# Patient Record
Sex: Male | Born: 1945 | Race: White | Hispanic: No | Smoking: Former smoker
Health system: Southern US, Community
[De-identification: ages and names within clinical notes are randomized; demographics above are authoritative.]

## PROBLEM LIST (undated history)

## (undated) DIAGNOSIS — N411 Chronic prostatitis: Secondary | ICD-10-CM

## (undated) DIAGNOSIS — L821 Other seborrheic keratosis: Secondary | ICD-10-CM

## (undated) DIAGNOSIS — K5790 Diverticulosis of intestine, part unspecified, without perforation or abscess without bleeding: Secondary | ICD-10-CM

## (undated) DIAGNOSIS — N400 Enlarged prostate without lower urinary tract symptoms: Secondary | ICD-10-CM

## (undated) DIAGNOSIS — R972 Elevated prostate specific antigen [PSA]: Secondary | ICD-10-CM

## (undated) DIAGNOSIS — E785 Hyperlipidemia, unspecified: Secondary | ICD-10-CM

## (undated) DIAGNOSIS — Z87891 Personal history of nicotine dependence: Secondary | ICD-10-CM

## (undated) DIAGNOSIS — F17200 Nicotine dependence, unspecified, uncomplicated: Secondary | ICD-10-CM

## (undated) DIAGNOSIS — K219 Gastro-esophageal reflux disease without esophagitis: Secondary | ICD-10-CM

## (undated) DIAGNOSIS — M4300 Spondylolysis, site unspecified: Secondary | ICD-10-CM

## (undated) DIAGNOSIS — I10 Essential (primary) hypertension: Secondary | ICD-10-CM

## (undated) DIAGNOSIS — I2510 Atherosclerotic heart disease of native coronary artery without angina pectoris: Secondary | ICD-10-CM

## (undated) DIAGNOSIS — F419 Anxiety disorder, unspecified: Secondary | ICD-10-CM

## (undated) DIAGNOSIS — Z9861 Coronary angioplasty status: Secondary | ICD-10-CM

## (undated) HISTORY — DX: Benign prostatic hyperplasia without lower urinary tract symptoms: N40.0

## (undated) HISTORY — DX: Diverticulosis of intestine, part unspecified, without perforation or abscess without bleeding: K57.90

## (undated) HISTORY — DX: Elevated prostate specific antigen (PSA): R97.20

## (undated) HISTORY — DX: Nicotine dependence, unspecified, uncomplicated: F17.200

## (undated) HISTORY — DX: Anxiety disorder, unspecified: F41.9

## (undated) HISTORY — DX: Chronic prostatitis: N41.1

## (undated) HISTORY — DX: Hyperlipidemia, unspecified: E78.5

## (undated) HISTORY — DX: Essential (primary) hypertension: I10

## (undated) HISTORY — DX: Coronary angioplasty status: Z98.61

## (undated) HISTORY — DX: Other seborrheic keratosis: L82.1

## (undated) HISTORY — DX: Gastro-esophageal reflux disease without esophagitis: K21.9

## (undated) HISTORY — PX: COLONOSCOPY: SHX174

## (undated) HISTORY — DX: Personal history of nicotine dependence: Z87.891

## (undated) HISTORY — DX: Spondylolysis, site unspecified: M43.00

## (undated) HISTORY — DX: Atherosclerotic heart disease of native coronary artery without angina pectoris: I25.10

## (undated) HISTORY — PX: CORONARY STENT PLACEMENT: SHX1402

---

## 1995-03-27 HISTORY — PX: POLYPECTOMY: SHX149

## 1998-03-28 ENCOUNTER — Inpatient Hospital Stay (HOSPITAL_COMMUNITY): Admission: EM | Admit: 1998-03-28 | Discharge: 1998-03-31 | Payer: Self-pay | Admitting: Cardiovascular Disease

## 1998-04-05 ENCOUNTER — Observation Stay (HOSPITAL_COMMUNITY): Admission: AD | Admit: 1998-04-05 | Discharge: 1998-04-06 | Payer: Self-pay | Admitting: Cardiovascular Disease

## 1998-05-16 ENCOUNTER — Inpatient Hospital Stay (HOSPITAL_COMMUNITY): Admission: EM | Admit: 1998-05-16 | Discharge: 1998-05-19 | Payer: Self-pay | Admitting: Emergency Medicine

## 2000-11-22 ENCOUNTER — Emergency Department (HOSPITAL_COMMUNITY): Admission: EM | Admit: 2000-11-22 | Discharge: 2000-11-22 | Payer: Self-pay | Admitting: *Deleted

## 2002-01-17 ENCOUNTER — Emergency Department (HOSPITAL_COMMUNITY): Admission: EM | Admit: 2002-01-17 | Discharge: 2002-01-17 | Payer: Self-pay | Admitting: Internal Medicine

## 2003-05-07 ENCOUNTER — Ambulatory Visit (HOSPITAL_COMMUNITY): Admission: RE | Admit: 2003-05-07 | Discharge: 2003-05-07 | Payer: Self-pay | Admitting: Family Medicine

## 2004-06-08 ENCOUNTER — Ambulatory Visit: Payer: Self-pay | Admitting: Cardiology

## 2005-04-24 ENCOUNTER — Ambulatory Visit (HOSPITAL_COMMUNITY): Admission: RE | Admit: 2005-04-24 | Discharge: 2005-04-24 | Payer: Self-pay | Admitting: Family Medicine

## 2005-06-05 ENCOUNTER — Ambulatory Visit: Payer: Self-pay | Admitting: Cardiology

## 2006-04-22 ENCOUNTER — Ambulatory Visit: Payer: Self-pay | Admitting: Cardiology

## 2006-05-22 ENCOUNTER — Encounter (INDEPENDENT_AMBULATORY_CARE_PROVIDER_SITE_OTHER): Payer: Self-pay | Admitting: Specialist

## 2006-05-22 ENCOUNTER — Other Ambulatory Visit: Admission: RE | Admit: 2006-05-22 | Discharge: 2006-05-22 | Payer: Self-pay | Admitting: Family Medicine

## 2007-06-04 ENCOUNTER — Ambulatory Visit: Payer: Self-pay | Admitting: Cardiology

## 2007-06-05 ENCOUNTER — Ambulatory Visit: Payer: Self-pay | Admitting: Cardiology

## 2007-08-27 ENCOUNTER — Ambulatory Visit: Payer: Self-pay | Admitting: Cardiology

## 2007-10-23 ENCOUNTER — Ambulatory Visit (HOSPITAL_COMMUNITY): Admission: RE | Admit: 2007-10-23 | Discharge: 2007-10-23 | Payer: Self-pay | Admitting: Family Medicine

## 2008-03-29 ENCOUNTER — Ambulatory Visit (HOSPITAL_COMMUNITY): Admission: RE | Admit: 2008-03-29 | Discharge: 2008-03-29 | Payer: Self-pay | Admitting: Family Medicine

## 2008-03-30 ENCOUNTER — Ambulatory Visit (HOSPITAL_COMMUNITY): Admission: RE | Admit: 2008-03-30 | Discharge: 2008-03-30 | Payer: Self-pay | Admitting: Family Medicine

## 2008-04-05 ENCOUNTER — Ambulatory Visit (HOSPITAL_COMMUNITY): Admission: RE | Admit: 2008-04-05 | Discharge: 2008-04-05 | Payer: Self-pay | Admitting: Family Medicine

## 2008-04-05 ENCOUNTER — Encounter (INDEPENDENT_AMBULATORY_CARE_PROVIDER_SITE_OTHER): Payer: Self-pay | Admitting: Interventional Radiology

## 2008-06-08 ENCOUNTER — Emergency Department (HOSPITAL_COMMUNITY): Admission: EM | Admit: 2008-06-08 | Discharge: 2008-06-08 | Payer: Self-pay | Admitting: Emergency Medicine

## 2008-07-02 ENCOUNTER — Ambulatory Visit (HOSPITAL_COMMUNITY): Admission: RE | Admit: 2008-07-02 | Discharge: 2008-07-02 | Payer: Self-pay | Admitting: Family Medicine

## 2008-08-20 ENCOUNTER — Encounter (INDEPENDENT_AMBULATORY_CARE_PROVIDER_SITE_OTHER): Payer: Self-pay | Admitting: *Deleted

## 2008-09-08 ENCOUNTER — Ambulatory Visit: Payer: Self-pay | Admitting: Cardiology

## 2008-09-10 ENCOUNTER — Encounter (INDEPENDENT_AMBULATORY_CARE_PROVIDER_SITE_OTHER): Payer: Self-pay | Admitting: *Deleted

## 2008-09-10 LAB — CONVERTED CEMR LAB: Cholesterol: 119 mg/dL

## 2008-10-21 ENCOUNTER — Ambulatory Visit (HOSPITAL_COMMUNITY): Payer: Self-pay | Admitting: Psychology

## 2009-01-06 ENCOUNTER — Telehealth (INDEPENDENT_AMBULATORY_CARE_PROVIDER_SITE_OTHER): Payer: Self-pay | Admitting: *Deleted

## 2009-01-06 ENCOUNTER — Encounter: Payer: Self-pay | Admitting: Cardiology

## 2009-05-11 ENCOUNTER — Encounter: Payer: Self-pay | Admitting: Cardiology

## 2009-05-19 LAB — CONVERTED CEMR LAB
AST: 30 units/L
Blood Glucose, Fasting: 80 mg/dL
CO2: 27 meq/L
Calcium: 9.3 mg/dL
Chloride: 105 meq/L
Cholesterol: 124 mg/dL
HDL: 39 mg/dL
Potassium: 4.8 meq/L
Sodium: 144 meq/L
Triglyceride fasting, serum: 163 mg/dL

## 2009-05-31 ENCOUNTER — Encounter (INDEPENDENT_AMBULATORY_CARE_PROVIDER_SITE_OTHER): Payer: Self-pay | Admitting: *Deleted

## 2009-06-02 ENCOUNTER — Ambulatory Visit: Payer: Self-pay | Admitting: Cardiology

## 2009-06-02 DIAGNOSIS — E785 Hyperlipidemia, unspecified: Secondary | ICD-10-CM

## 2009-06-02 DIAGNOSIS — I10 Essential (primary) hypertension: Secondary | ICD-10-CM | POA: Insufficient documentation

## 2009-06-02 DIAGNOSIS — F419 Anxiety disorder, unspecified: Secondary | ICD-10-CM

## 2009-06-02 DIAGNOSIS — I2510 Atherosclerotic heart disease of native coronary artery without angina pectoris: Secondary | ICD-10-CM | POA: Insufficient documentation

## 2009-06-02 DIAGNOSIS — K219 Gastro-esophageal reflux disease without esophagitis: Secondary | ICD-10-CM

## 2009-06-06 ENCOUNTER — Encounter (INDEPENDENT_AMBULATORY_CARE_PROVIDER_SITE_OTHER): Payer: Self-pay | Admitting: *Deleted

## 2009-06-06 LAB — CONVERTED CEMR LAB: OCCULT 1: NEGATIVE

## 2010-01-23 IMAGING — CT CT CHEST W/ CM
1 series · 15 of 32 positions shown, 19 images · IV contrast (Omnipaque 300)
Comparison: None
Correlation:  Chest radiograph 03/29/2008

CLINICAL DATA: Cough, abnormal chest x-ray with left lung density
elevated ESR 80, WBC 11K

CT CHEST WITH CONTRAST
TECHNIQUE: Multidetector CT imaging of the chest was performed
following the standard protocol during bolus administration of
intravenous contrast.Sagittal and coronal MPR images reconstructed
from axial data set.
Contrast: 80 ml Emnipaque-D00

[Series 2: chestroutine 5.0 b40f · axial · 0.68mm/px · z∈[-306,-66]mm · 15 of 55 slices shown, 19 images]
[im 5/55  mediastinal]
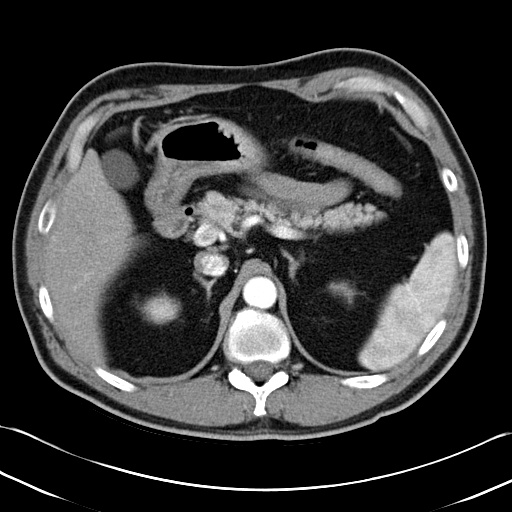
[im 5/55  lung]
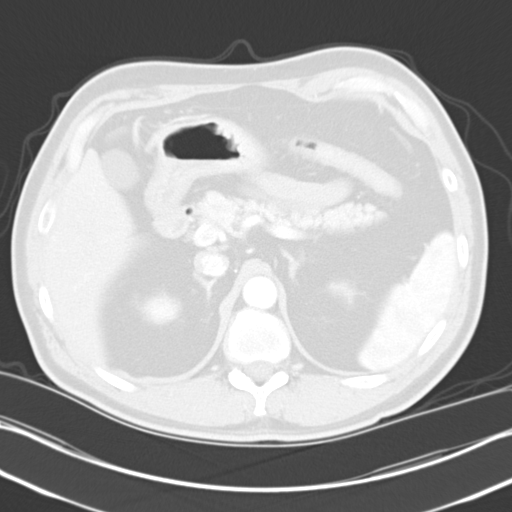
[im 9/55  lung]
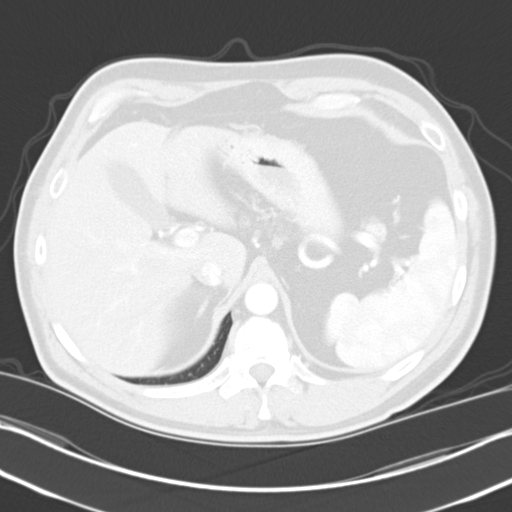
[im 13/55  lung]
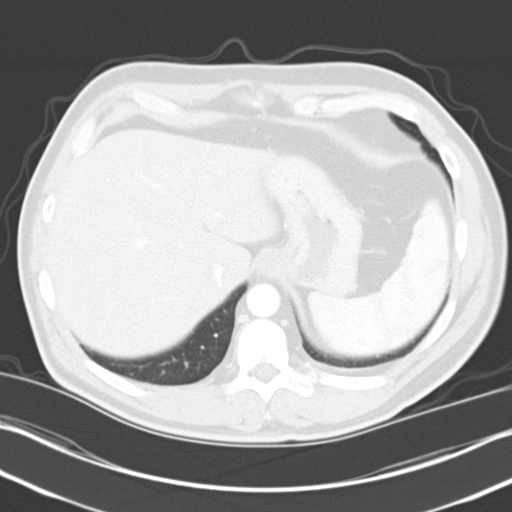
[im 17/55  lung]
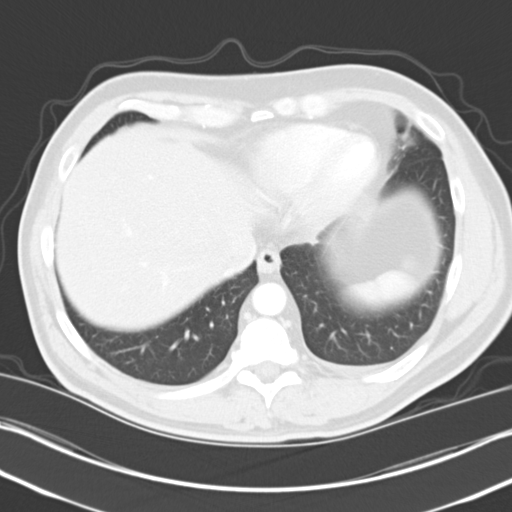
[im 21/55  mediastinal]
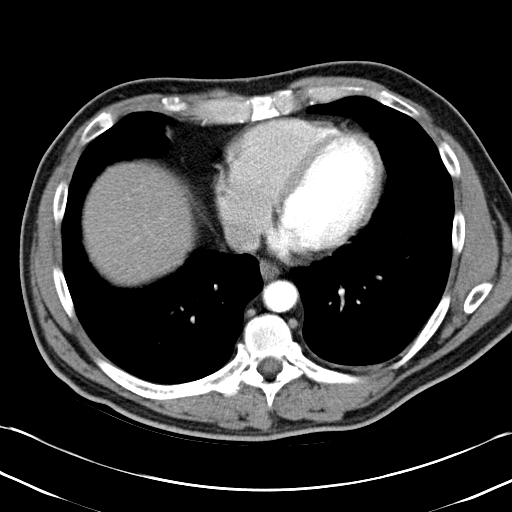
[im 21/55  lung]
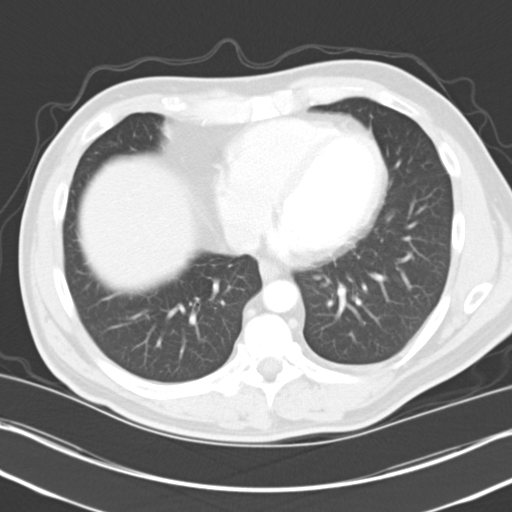
[im 23/55  lung]
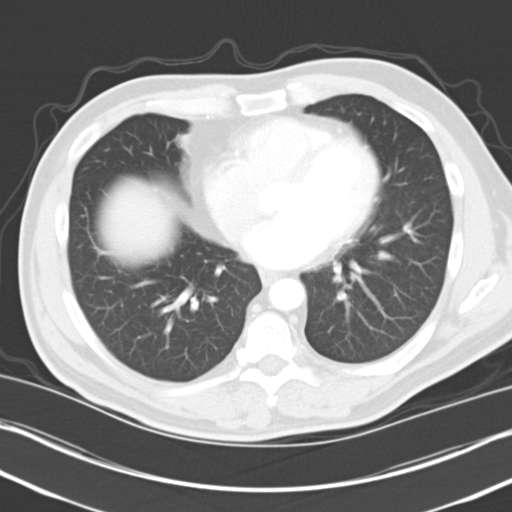
[im 26/55  lung]
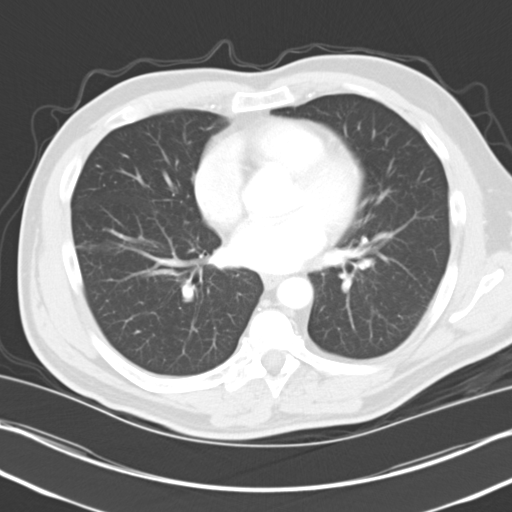
[im 29/55  lung]
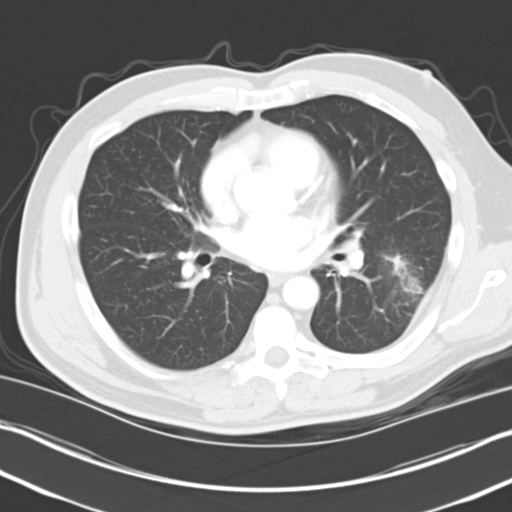
[im 31/55  mediastinal]
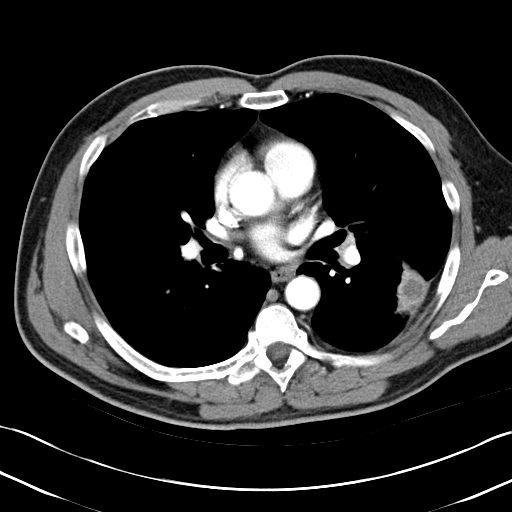
[im 31/55  lung]
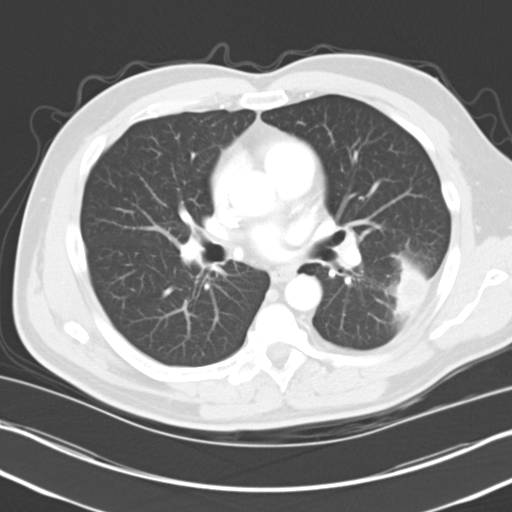
[im 35/55  lung]
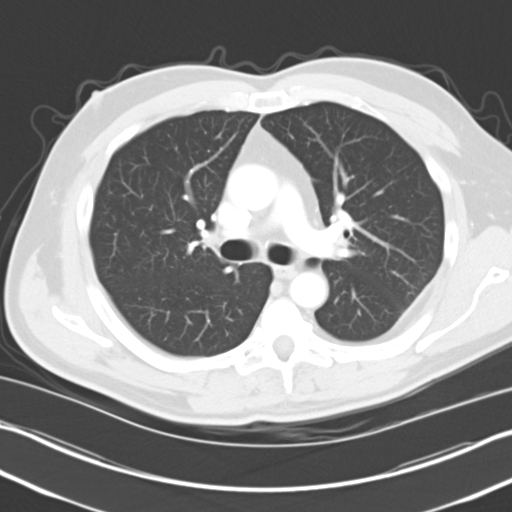
[im 39/55  lung]
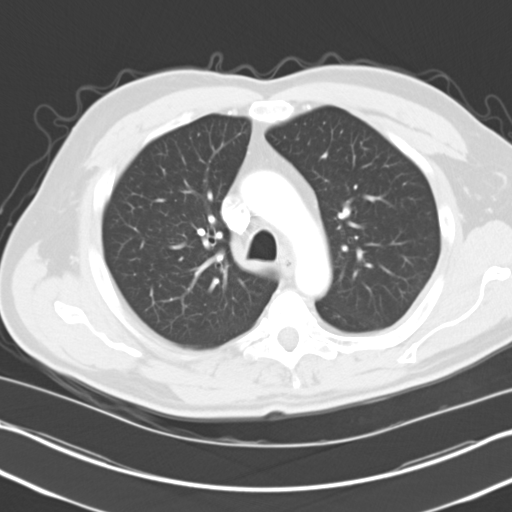
[im 43/55  lung]
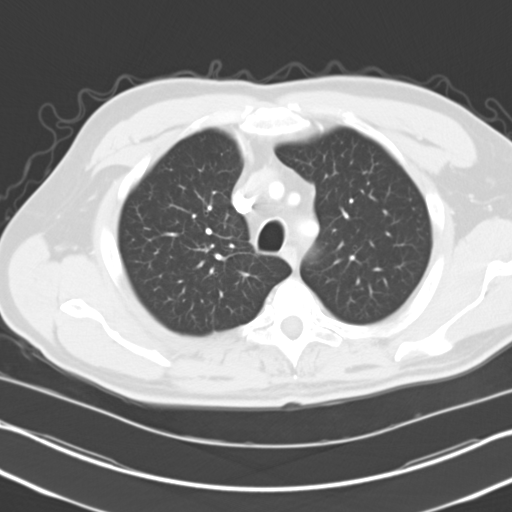
[im 45/55  mediastinal]
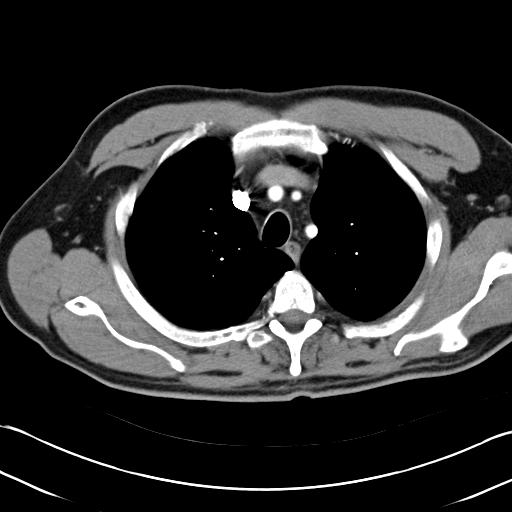
[im 45/55  lung]
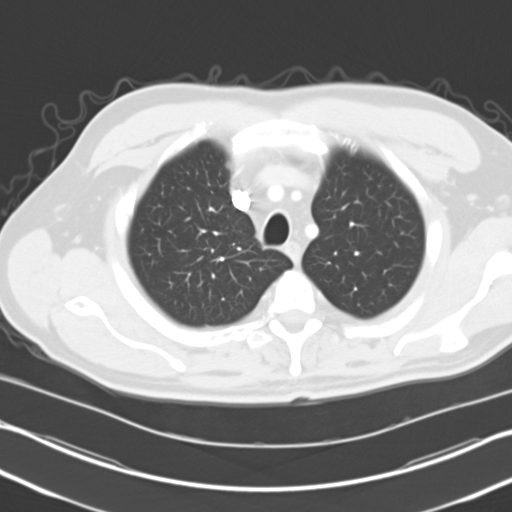
[im 49/55  lung]
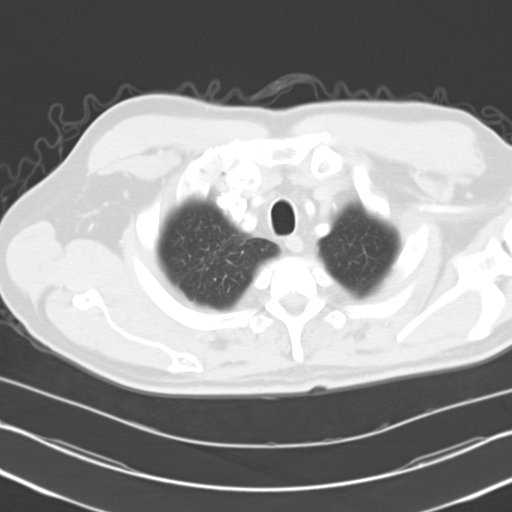
[im 53/55  lung]
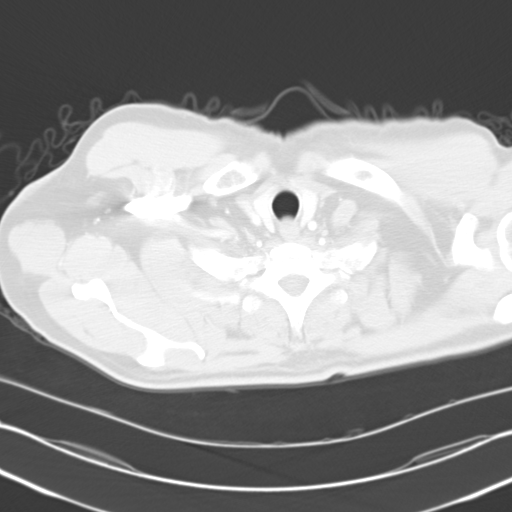

[15 of 32 positions shown; findings below may reference images not displayed]

FINDINGS: Thoracic vascular structures patent on non dedicated exam.
No thoracic adenopathy.
Minimal colonic diverticulosis transverse colon.
Remaining visualized portions of upper abdomen unremarkable.

3.7 x 2.2 x 1.9 cm diameter peripheral mass identified in lateral
aspect, superior segment left lower lobe, abutting the minor
fissure and lateral visceral pleura.
Minimal surrounding poor definition/infiltrate or atelectasis.
Central low attenuation within lesion.
Overlying pleural retraction is identified at posterolateral aspect
of superior segment left lower lobe.
Adjacent subpleural fat deposition in posterolateral left chest,
nonspecific.
Associated pleural thickening.
No focal bony lesions.
IMPRESSION: 3.7 x 2.2 x 1.9 cm diameter peripheral mass in lateral aspect,
superior segment left lower lobe, abutting visceral pleura, with
associated soft central low attenuation (? necrosis), pleural
thickening, and pleural retraction.
Finding is more suspicious for primary lung neoplasm and is felt to
less likely represent potential infection/lung abscess.
No associated adenopathy or metastatic disease identified.

Critical test results telephoned to Dr. Dalvi at the time of
interpretation on 03/30/2008 at 3333 hours.

## 2010-03-22 ENCOUNTER — Ambulatory Visit (HOSPITAL_COMMUNITY): Payer: Self-pay | Admitting: Psychology

## 2010-04-25 NOTE — Miscellaneous (Signed)
Summary: stool cards 06/06/2009  Clinical Lists Changes  Observations: Added new observation of HEMOCCULT 3: neg (06/06/2009 15:43) Added new observation of HEMOCCULT 2: neg (06/06/2009 15:43) Added new observation of HEMOCCULT 1: neg (06/06/2009 15:43)

## 2010-04-25 NOTE — Assessment & Plan Note (Signed)
Summary: ROV      Allergies Added: NKDA  Visit Type:  Follow-up Primary Provider:  Dr. Katharine Look   History of Present Illness: Return visit as scheduled for this very pleasant 65 year old gentleman with coronary artery disease and multiple cardiovascular risk factors.  He continues to be asymptomatic and active.  He exercises at the gym approximately 2 days per week.  Unfortunately, he continues to experience stress related to the multiple medical problems of his wife.  He has refrained from cigarette smoking since his initial symptoms of coronary disease.   Laboratory studies performed in conjunction with his job revealed a normal chemistry profile, normal CBC and an excellent lipid profile.  Colonoscopy was performed to approximately 12 or 13 years ago by Dr. Victorino Dike.  He denies all GI symptoms.  -  Date:  05/19/2009    Cholesterol: 124    LDL: 52    HDL: 39    Triglycerides: 295    FBS 80    Alk Phos: 79    SGPT (ALT): 36    SGOT (AST): 30    Bilirubin-total: 0.5    BUN: 15    Creatinine: 1.04    Sodium: 144    Potassium: 4.8    Chloride: 105    CO2 Total: 27    Calcium: 9.3    Total Protein: 7.1    Albumin: 4.5   Current Medications (verified): 1)  Lipitor 20 Mg Tabs (Atorvastatin Calcium) 2)  Lisinopril-Hydrochlorothiazide 20-12.5 Mg Tabs (Lisinopril-Hydrochlorothiazide) .... Take 1 Tablet By Mouth Once A Day 3)  Metoprolol Succinate 50 Mg Xr24h-Tab (Metoprolol Succinate) .... Take 1 Tablet By Mouth Once A Day 4)  Aspir-Low 81 Mg Tbec (Aspirin)  Allergies (verified): No Known Drug Allergies  Past History:  PMH, FH, and Social History reviewed and updated.  Past Medical History: ASCVD: PTCA of the LAD in 03/1998; recath for a few weeks later showed no restenosis; apical MI in 04/1998       requiring stent to the LAD and HSRA of D1 DYSLIPIDEMIA (ICD-272.4) HYPERTENSION (ICD-401.9) Tobacco abuse-40 pack years discontinued in 03/1998 GERD  (ICD-530.81) ANXIETY (ICD-300.00)  Past Surgical History: Polyp removed vocal cords (1997)  Review of Systems       See history of present illness.  Vital Signs:  Patient profile:   65 year old male Height:      67 inches Weight:      173 pounds BMI:     27.19 Pulse rate:   60 / minute BP sitting:   123 / 80  (right arm)  Vitals Entered By: Dreama Saa, CNA (June 02, 2009 1:25 PM)  Physical Exam  General:    Proportional weight and height; well developed; no acute distress:   Neck-No JVD; no carotid bruits: Lungs-No tachypnea, no rales; no rhonchi; no wheezes: Cardiovascular-normal PMI; normal S1 and S2: Abdomen-BS normal; soft and non-tender without masses or organomegaly:  Musculoskeletal-No deformities, no cyanosis or clubbing: Neurologic-Normal cranial nerves; symmetric strength and tone:  Skin-Warm, no significant lesions: Extremities-Nl distal pulses; no edema:     Impression & Recommendations:  Problem # 1:  ATHEROSCLEROTIC CARDIOVASCULAR DISEASE (ICD-429.2) Patient remains stable with respect to this problem, now 10 years following acute MI and percutaneous intervention for restenosis.  We will continue current approach, optimization of risk factor control.  Problem # 2:  HYPERTENSION (ICD-401.9) Blood pressure control has been excellent with BP systolic typically 110 when measured by the patient.  Current medications will be continued.  Problem # 3:  HYPERLIPIDEMIA (ICD-272.4) Recent lipid profile was excellent with total cholesterol of 124, HDL 39, triglycerides of 163 and LDL of 52.  Current therapy will be continued.  Patient concerned that triglycerides are higher than they have been in the past.  Fish oil, increased exercise and one glass of red wine per night advised.  I will reassess this nice gentleman in one year.  Other Orders: Hemoccult Cards (Take Home) (Hemoccult Cards)  Patient Instructions: 1)  Your physician recommends that you schedule  a follow-up appointment in: 1 YEAR 2)  Your physician has asked that you test your stool for blood. It is necessary to test 3 different stool specimens for accuracy. You will be given 3 hemoccult cards for specimen collection. For each stool specimen, place a small portion of stool sample (from 2 different areas of the stool) into the 2 squares on the card. Close card. Repeat with 2 more stool specimens. Bring the cards back to the office for testing.

## 2010-04-25 NOTE — Miscellaneous (Signed)
Summary: LABS LIPIDS,09/10/2008  Clinical Lists Changes  Observations: Added new observation of LDL: 62 mg/dL (04/54/0981 19:14) Added new observation of HDL: 37 mg/dL (78/29/5621 30:86) Added new observation of TRIGLYC TOT: 98 mg/dL (57/84/6962 95:28) Added new observation of CHOLESTEROL: 119 mg/dL (41/32/4401 02:72)

## 2010-04-25 NOTE — Miscellaneous (Signed)
Summary: lisinopril/hctz refill  Clinical Lists Changes  Medications: Changed medication from LISINOPRIL-HYDROCHLOROTHIAZIDE 20-12.5 MG TABS (LISINOPRIL-HYDROCHLOROTHIAZIDE) to LISINOPRIL-HYDROCHLOROTHIAZIDE 20-12.5 MG TABS (LISINOPRIL-HYDROCHLOROTHIAZIDE) Take 1 tablet by mouth once a day - Signed Rx of LISINOPRIL-HYDROCHLOROTHIAZIDE 20-12.5 MG TABS (LISINOPRIL-HYDROCHLOROTHIAZIDE) Take 1 tablet by mouth once a day;  #30 x 3;  Signed;  Entered by: Teressa Lower RN;  Authorized by: Kathlen Brunswick, MD, [facility];  Method used: Electronically to [facility]*, [address] [place], [place], [place]  [address], Ph: [phone] or [phone], Fax: [phone]    Prescriptions: LISINOPRIL-HYDROCHLOROTHIAZIDE 20-12.5 MG TABS (LISINOPRIL-HYDROCHLOROTHIAZIDE) Take 1 tablet by mouth once a day  #30 x 3   Entered by:   Teressa Lower RN   Authorized by:   Kathlen Brunswick, MD, [facility]   Signed by:   Teressa Lower RN on 05/11/2009   Method used:   Electronically to        [facility]* (retail)       [address]       [place], [place]  [address]       Ph: [phone] or [phone]       Fax: [phone]   RxID:   [id number]

## 2010-06-20 ENCOUNTER — Other Ambulatory Visit: Payer: Self-pay | Admitting: *Deleted

## 2010-06-20 DIAGNOSIS — E785 Hyperlipidemia, unspecified: Secondary | ICD-10-CM

## 2010-06-20 MED ORDER — ATORVASTATIN CALCIUM 20 MG PO TABS: 20.0000 mg | ORAL_TABLET | Freq: Every day | ORAL | Status: DC

## 2010-06-21 ENCOUNTER — Ambulatory Visit (INDEPENDENT_AMBULATORY_CARE_PROVIDER_SITE_OTHER): Payer: BC Managed Care – PPO | Admitting: Cardiology

## 2010-06-21 ENCOUNTER — Encounter: Payer: Self-pay | Admitting: Cardiology

## 2010-06-21 VITALS — BP 93/71 | HR 79 | Ht 67.0 in | Wt 174.0 lb

## 2010-06-21 DIAGNOSIS — I10 Essential (primary) hypertension: Secondary | ICD-10-CM

## 2010-06-21 DIAGNOSIS — F17201 Nicotine dependence, unspecified, in remission: Secondary | ICD-10-CM | POA: Insufficient documentation

## 2010-06-21 DIAGNOSIS — E785 Hyperlipidemia, unspecified: Secondary | ICD-10-CM

## 2010-06-21 DIAGNOSIS — I2510 Atherosclerotic heart disease of native coronary artery without angina pectoris: Secondary | ICD-10-CM

## 2010-06-21 DIAGNOSIS — Z87891 Personal history of nicotine dependence: Secondary | ICD-10-CM

## 2010-06-21 MED ORDER — OMEGA-3 FATTY ACIDS 1000 MG PO CAPS: 1.0000 g | ORAL_CAPSULE | Freq: Two times a day (BID) | ORAL | Status: AC

## 2010-06-21 MED ORDER — ATORVASTATIN CALCIUM 20 MG PO TABS: 20.0000 mg | ORAL_TABLET | Freq: Every day | ORAL | Status: DC

## 2010-06-21 NOTE — Progress Notes (Signed)
HPI : Mr. Brent Brock continues to do well, no 12 years following acute MI requiring urgent intervention.  Risk factor control has been superb.  He has decreased exercise, currently achieving only one session per week.  He has tried to lose weight recently, but has not been particular successful.  He is surprised at how well he has managed the stress associated with his wife's multiple medical problems.  Current Outpatient Prescriptions on File Prior to Visit  Medication Sig Dispense Refill  . aspirin 81 MG tablet Take 81 mg by mouth daily.        Marland Kitchen lisinopril-hydrochlorothiazide (PRINZIDE,ZESTORETIC) 20-12.5 MG per tablet Take 1 tablet by mouth daily.        . metoprolol (TOPROL-XL) 50 MG 24 hr tablet Take 50 mg by mouth daily.        Marland Kitchen DISCONTD: atorvastatin (LIPITOR) 20 MG tablet Take 1 tablet (20 mg total) by mouth at bedtime.  30 tablet  3     No Known Allergies    Past medical history, social history, and family history reviewed and updated.  ROS: General: no anorexia, weight gain or weight loss Cardiac: no chest pain, dyspnea, orthopnea, PND,  or syncope Respiratory: no cough, sputum production or hemoptysis GI: no nausea, abdominal pain, emesis, diarrhea or constipation Integument: no significant lesions Neurologic: No muscle weakness or paralysis; no speech disturbance; no headache  PHYSICAL EXAM: BP 93/71  Pulse 79  Ht 5\' 7"  (1.702 m)  Wt 174 lb (78.926 kg)  BMI 27.25 kg/m2  SpO2 95%  General-Well developed; no acute distress Body habitus-proportionate weight and height Neck-No JVD; no carotid bruits Lungs-clear lung fields; resonant to percussion Cardiovascular-normal PMI; normal S1 and S2 Abdomen-normal bowel sounds; soft and non-tender without masses or organomegaly Musculoskeletal-No deformities, no cyanosis or clubbing Neurologic-Normal cranial nerves; symmetric strength and tone Skin-Warm, no significant lesions Extremities-distal pulses intact; no edema  ASSESSMENT  AND PLAN:

## 2010-06-21 NOTE — Assessment & Plan Note (Addendum)
Functional status remains excellent with no symptoms referable to coronary artery disease.  Patient has done extremely well with optimal control of cardiovascular risk factors and hopefully will continue to do so.  Recent laboratory includes a normal complete metabolic profile, normal CBC and normal TSH level.

## 2010-06-21 NOTE — Assessment & Plan Note (Signed)
Blood pressure remains excellent; current medications will be continued.

## 2010-06-21 NOTE — Patient Instructions (Signed)
Your physician recommends that you schedule a follow-up appointment in:1 YEAR   Your physician has recommended you make the following change in your medication: FISH OIL 100MG  DAILY   INCREASE EXERCISE

## 2010-06-21 NOTE — Assessment & Plan Note (Signed)
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

## 2010-07-10 LAB — CBC
HCT: 39.2 % (ref 39.0–52.0)
Hemoglobin: 13.5 g/dL (ref 13.0–17.0)
MCHC: 34.5 g/dL (ref 30.0–36.0)
MCV: 88.5 fL (ref 78.0–100.0)
RBC: 4.43 MIL/uL (ref 4.22–5.81)

## 2010-07-10 LAB — TISSUE CULTURE

## 2010-07-19 ENCOUNTER — Other Ambulatory Visit: Payer: Self-pay | Admitting: Cardiology

## 2010-08-08 NOTE — Letter (Signed)
June 05, 2007    Brent Brock. Brent Brock, M.D.  [address]  [place], [place] [address]   RE:  Brent Brock, Brent Brock  MRN:  [id number]  /  DOB:  Jan 07, 1946   Dear Brent Brock:   [name] returned to the office today.  He did very well on a stress  test achieving a workload of 12 mets and a heart rate in excess of 160  with no ischemic changes and no symptoms.  We further discussed  psychological issues.  He has been worried about his wife, whose health  has been worse in recent weeks.  He also was dealing with his son-in-  law's possible deployment to one of our war zones and the need for Mr.  Hevia to take care of his three children, all of whom are under the age  of 24.  Fortunately, his son-in-law has permitted to resume his work in  [place] with the [employer].  [name] is able to get to sleep without  difficulty, but wakes up in the middle of the night worrying about one  or more of these issues.  He has multiple additional awakenings during  the night, but seems to get a fairly reasonable amount of sleep.  Nonetheless, he has had more daytime somnolence.  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   I certainly believe that his principle issue is anxiety.  He has some  chronic problems exacerbated by short term stress.  I have given him a  prescription for Sonata to be used for middle of the night awakening and  for Ativan to be used for daytime symptoms.  I emphasize  that he should use these as little as possible and have given him only a  41-month supply.  We talked about some behavioral methods for dealing  with stress and for improving sleep efficiency.  I noted that he could  consider referral to Behavioral Health if he continued to have problems  and should contact you for further follow-up.  I will plan to reassess  this nice gentleman in 2 months.    Sincerely,      Gerrit Friends. Dietrich Pates, MD, [facility]  Electronically Signed    RMR/MedQ  DD: 06/05/2007  DT: 06/06/2007   Job #: [id number]

## 2010-08-08 NOTE — Letter (Signed)
September 08, 2008    [name]. Sudie Bailey, MD  [address] [place], [place] [address]   RE:  Brent Brock, Brent Brock  MRN:  [id number]  /  DOB:  [birth year]   Dear Brent Brock,   Brent Brock returns to the office as scheduled for continued assessment  and treatment of coronary artery disease and cardiovascular risk  factors.  Since restenosis of his angioplasty site in February 2000, he  has done extraordinarily well.  He is active including exercising in the  gym without cardiopulmonary symptoms.  He notes no pedal edema.  Blood  pressure control has been excellent.  He has not had a recent lipid  profile, but that was also excellent when last checked more than a year  ago.  He continues to refrain from cigarette smoking.  He is a model  patient.   Medications are was unchanged from last year including,  1. Atorvastatin 20 mg daily.  2. Lisinopril/hydrochlorothiazide 20/12.5 mg daily.  3. Metoprolol ER 50 mg daily.  4. Aspirin 81 mg daily.  5. He uses Xanax and Sonata p.r.n.   His social situation is stable.  His wife has multiple chronic illnesses  and requires a great deal of care.  He has multiple children and  grandchildren in the area, who enjoys.  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   PHYSICAL EXAMINATION:  GENERAL:  Pleasant proportionate gentleman in no  acute distress.  VITAL SIGNS:  The weight is 170, 4 pounds more than last year.  Blood  pressure 90/75, heart rate 60 and regular, respirations 12 and  unlabored.  NECK:  No jugular venous distention; normal carotid upstrokes without  bruits.  LUNGS:  Clear.  CARDIAC:  Normal first and second heart sounds; modest systolic ejection  murmur.  ABDOMEN:  Soft and nontender; no bruits; no organomegaly.  EXTREMITIES:  Normal distal pulses; no edema.   IMPRESSION:  Brent Brock continues to do very well, now 10 years following  intervention for single-vessel coronary artery disease.  Risk factor  control has been superb.  He  is encouraged to resume fish oil 1 b.i.d.  We discussed the possibility of decreasing metoprolol due to his  relatively low blood pressure.  He has no orthostatic symptoms and no  lightheadedness, whatsoever.  He is very happy with his medical regime.  I have renewed all of his medications and we will plan to see this nice  gentleman again in 1 year.  A chemistry profile and lipid profile are  pending.    Sincerely,      Gerrit Friends. Dietrich Pates, MD, [facility]  Electronically Signed    RMR/MedQ  DD: 09/08/2008  DT: 09/09/2008  Job #: [id number]

## 2010-08-08 NOTE — Letter (Signed)
August 27, 2007    [name]. Sudie Bailey, M.D.  [address] [place], [place] [address]   RE:  Brent Brock, Brent Brock  MRN:  [id number]  /  DOB:  05-21-1945   Dear Brett Canales:   Brent Brock returns to the office following a stress test.  He did  superbly on that study, achieving a workload of 12 METS with no chest  discomfort and a normal electrocardiographic response.  Since his last  visit, he has taken a handful of Xanax and one Sonata.  His anxiety  level is now under control, and he feels quite well.   EXAM:  Pleasant gentleman in no acute distress.  The weight is 166, 5 pounds more than in March.  Blood pressure 100/65,  heart rate 65 and regular, respirations 18.  NECK:  No jugular venous distention.  LUNGS:  Clear.  CARDIAC:  Normal first and second heart sounds.   Brent Brock continues to do well, now more than 9 years following a  multiple interventions for coronary disease.  I will plan to see this  nice gentleman again in 1 year.    Sincerely,      Gerrit Friends. Dietrich Pates, MD, [facility]  Electronically Signed    RMR/MedQ  DD: 08/27/2007  DT: 08/27/2007  Job #: [id number]

## 2010-08-08 NOTE — Letter (Signed)
June 04, 2007    [name]. Sudie Bailey, M.D.  [address]  [place], [place] [address]   RE:  Brent Brock, Brent Brock  MRN:  [id number]  /  DOB:  Sep 13, 1945   Dear Brett Canales:   Mr. Garrels is seen in the office today at his request for episodic chest  and left arm discomfort.  As you know, this nice gentleman has done  superbly since multiple interventions in early 2000 for coronary disease  in the LAD system.  He now presents with fairly vague chest and arm  discomfort in the setting of increased anxiety.  He also describes a  significant sleep disturbance.  His grandchildren have been living with  him during a threatened military deployment of their father.  That  situation has recently resolved.  His wife has been falling and  apparently losing consciousness.  He describes increased anxiety related  to this and related to his chest discomfort which is vague,  unpredictable, and passes spontaneously.  He has had some similar  discomfort in the upper left arm.  There are no associated symptoms.  There is no relationship to exertion, although he has been much less  active in recent weeks.  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   CURRENT MEDICATIONS:  1. Include atorvastatin 20 mg daily.  2. Lisinopril/hydrochlorothiazide 20/12.5 mg daily.  3. Metoprolol ER 50 mg daily.  4. Prevacid 30 mg daily.  5. Aspirin 81 mg daily.   He had a recent lipid profile, chemistry profile, and CBC that are all  excellent.   EXAM:  Pleasant gentleman at an appropriate weight and in no acute  distress.  The weight is 161, 8 pounds less than last year.  Blood pressure 115/80,  heart rate 80 and regular, respirations 18.  NECK:  No jugular venous distention; no carotid bruits.  LUNGS:  Clear.  CARDIAC:  Normal first and second heart sounds; minimal systolic  ejection murmur.  ABDOMEN:  Soft and nontender; no organomegaly.  EXTREMITIES:  No edema; normal distal pulses.   EKG:  Normal sinus  rhythm; within normal limits; no change when compared  to a prior tracing of April 04, 1998.   IMPRESSION:  Mr. Hyser has nonanginal chest discomfort in the setting of  increased anxiety.  I think it most likely that this is somatization,  but certainly there is concern for recurrent symptoms of coronary  disease.  We will proceed with a graded exercise test tomorrow.  If  results are good, I will give him a short term course of benzodiazepines  and reevaluate him in 2 months.    Sincerely,      Gerrit Friends. Dietrich Pates, MD, [facility]  Electronically Signed    RMR/MedQ  DD: 06/04/2007  DT: 06/05/2007  Job #: [id number]

## 2010-08-08 NOTE — Procedures (Signed)
[facility]                              EXERCISE TREADMILL   NAME:Imler, SHADOW SCHEDLER                       MRN:          [id number]  DATE:06/05/2007                            DOB:          02/26/1946    CLINICAL DATA:  65 year old gentleman with prior myocardial infarction  now presents with recurrent chest discomfort.   1. Treadmill exercise performed to a workload of 12 METs and a heart      rate of 176, 110% of the patient's age-predicted maximum.  No chest      discomfort reported. The test was terminated due to generalized      fatigue, leg discomfort, and dyspnea.  2. No arrhythmia is noted.  3. Blood pressure increased from a resting value of 120/80 to 170/70,      a normal response.   Baseline EKG: Normal sinus rhythm; within normal limits.   Stress EKG: No significant change.   IMPRESSION:  Adequate and negative graded exercise test.     Gerrit Friends. Dietrich Pates, MD, [facility]  Electronically Signed    RMR/MedQ  DD: 06/05/2007  DT: 06/05/2007  Job #: [id number]   cc:   [name]. Sudie Bailey, M.D.

## 2010-08-11 NOTE — Letter (Signed)
April 22, 2006    [name]. Sudie Bailey, M.D.  [address]  [place], [place] [address]   RE:  WENTWORTH, EDELEN  MRN:  [id number]  /  DOB:  Jun 22, 1945   Dear Brett Canales:   Brent Brock returns to the office for continued assessment and treatment  of coronary disease.  He continues to do well with no symptoms.  He is  tolerating his medicines well.  He recently had lab that was generally  good.  Fasting glucose was slightly elevated to 106.  His HDL remains  suboptimal, most recently with a value of 34.  Total cholesterol is  excellent at 122, lipids at 82, and LDL at 72.   EXAM:  Pleasant, proportionate gentleman in no acute distress.  Weight is 173, 7 pounds more than last year.  Blood pressure 120/80,  heart rate 70 and regular, respirations 16.  NECK:  No jugular venous distension; normal carotid upstrokes without  bruits.  CARDIAC:  Normal first and second heart sounds; fourth heart sound  present; normal PMI.  LUNGS:  Clear.  ABDOMEN:  Soft and nontender; no bruits; no masses; no organomegaly;  aortic pulsation not palpable.  EXTREMITIES:  Normal distal pulses.  No edema.   IMPRESSION:  Mr. Carino is doing generally well.  We reinforced our  recommendation for fish oil on a daily basis.  He received influenza  vaccine this year, but has never received pneumococcal vaccine.  I will  plan to see this nice gentleman again in 1 year.    Sincerely,      Gerrit Friends. Dietrich Pates, MD, [facility]  Electronically Signed    RMR/MedQ  DD: 04/22/2006  DT: 04/22/2006  Job #: [id number]

## 2010-10-20 ENCOUNTER — Other Ambulatory Visit: Payer: Self-pay | Admitting: *Deleted

## 2010-10-20 DIAGNOSIS — E785 Hyperlipidemia, unspecified: Secondary | ICD-10-CM

## 2010-10-20 MED ORDER — ATORVASTATIN CALCIUM 20 MG PO TABS: 20.0000 mg | ORAL_TABLET | Freq: Every day | ORAL | Status: DC

## 2011-01-24 ENCOUNTER — Other Ambulatory Visit: Payer: Self-pay

## 2011-01-24 MED ORDER — LISINOPRIL-HYDROCHLOROTHIAZIDE 20-12.5 MG PO TABS: 1.0000 | ORAL_TABLET | Freq: Every day | ORAL | Status: DC

## 2011-03-16 ENCOUNTER — Telehealth: Payer: Self-pay | Admitting: Cardiology

## 2011-03-16 MED ORDER — METOPROLOL SUCCINATE ER 50 MG PO TB24: 50.0000 mg | ORAL_TABLET | Freq: Every day | ORAL | Status: DC

## 2011-03-16 NOTE — Telephone Encounter (Signed)
Please call pharmacy regarding clarifications/ tg

## 2011-03-26 ENCOUNTER — Other Ambulatory Visit: Payer: Self-pay

## 2011-03-26 MED ORDER — LISINOPRIL-HYDROCHLOROTHIAZIDE 20-12.5 MG PO TABS: 1.0000 | ORAL_TABLET | Freq: Every day | ORAL | Status: DC

## 2011-06-12 ENCOUNTER — Ambulatory Visit: Payer: BC Managed Care – PPO | Admitting: Adult Health

## 2011-06-12 ENCOUNTER — Ambulatory Visit: Payer: BC Managed Care – PPO | Admitting: Cardiology

## 2011-06-19 ENCOUNTER — Other Ambulatory Visit: Payer: Self-pay | Admitting: Adult Health

## 2011-06-19 ENCOUNTER — Ambulatory Visit (INDEPENDENT_AMBULATORY_CARE_PROVIDER_SITE_OTHER): Payer: Medicare Other | Admitting: Adult Health

## 2011-06-19 ENCOUNTER — Encounter: Payer: Self-pay | Admitting: Adult Health

## 2011-06-19 VITALS — BP 115/79 | HR 62 | Ht 67.0 in | Wt 175.0 lb

## 2011-06-19 DIAGNOSIS — I2510 Atherosclerotic heart disease of native coronary artery without angina pectoris: Secondary | ICD-10-CM

## 2011-06-19 DIAGNOSIS — R001 Bradycardia, unspecified: Secondary | ICD-10-CM

## 2011-06-19 DIAGNOSIS — E785 Hyperlipidemia, unspecified: Secondary | ICD-10-CM

## 2011-06-19 DIAGNOSIS — I498 Other specified cardiac arrhythmias: Secondary | ICD-10-CM

## 2011-06-19 NOTE — Assessment & Plan Note (Signed)
TC 123, TG 68, HDL 35, LDL 74.  He is on fish oil and lovastatin. LFT's are not evaluated on current labs.  I have encouraged him to exercise more if possible.

## 2011-06-19 NOTE — Patient Instructions (Signed)
Your physician recommends that you schedule a follow-up appointment in: 1 month with Dr Dietrich Pates  Your physician recommends that you return for lab work in: Today

## 2011-06-19 NOTE — Assessment & Plan Note (Signed)
He is without cardiac complaint at this time. He remains active. Labs have been drawn per [facility] with mildly elevated TSH He wishes to discuss this with Dr. Dietrich Pates further. May need to have T3 and T4.

## 2011-06-19 NOTE — Progress Notes (Signed)
   HPI: Brent Brock is a very pleasant 66 y/o patient of Dr. Dietrich Pates we are following for CAD with known history MI in 2001, hypertension, and hypercholesterolemia.  He is also followed by Dr. Sudie Bailey for PCP. He is without complaint of chest pain or shortness of breath. He is caring for a very ill wife with Cancer and CHF. He had recent lab completed to include TSH. It was found to be abnormal with a level of 5.947. He is concerned about this. He exhibits no complaints of fatigue, dizziness, or frequent sleeping. He remains active but is usually busy caring for his wife at home.  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 He has had no ER visits or hospitalizations since last office visit with Dr. Dietrich Pates.  No Known Allergies  Current Outpatient Prescriptions  Medication Sig Dispense Refill  . aspirin 81 MG tablet Take 81 mg by mouth daily.        Marland Kitchen atorvastatin (LIPITOR) 20 MG tablet Take 1 tablet (20 mg total) by mouth at bedtime.  30 tablet  6  . fish oil-omega-3 fatty acids 1000 MG capsule Take 1 capsule (1 g total) by mouth 2 (two) times daily.  60 capsule  3  . lisinopril-hydrochlorothiazide (PRINZIDE,ZESTORETIC) 20-12.5 MG per tablet Take 1 tablet by mouth daily.  30 tablet  3  . metoprolol (TOPROL-XL) 50 MG 24 hr tablet Take 1 tablet (50 mg total) by mouth daily.  90 tablet  3    Past Medical History  Diagnosis Date  . ASCVD (arteriosclerotic cardiovascular disease)      PTCA of LAD in 03/1998; recath a few weeks later-no restenosis; apical MI in 04/1998; restenosis of the LAD-stent placed; rotational atherectomy of first diagonal  . Hyperlipidemia   . Hypertension   . Past use of tobacco     40 pack years discontinued in 03/1998  . GERD (gastroesophageal reflux disease)   . Anxiety     Past Surgical History  Procedure Date  . Polypectomy 1997    Polyps removed on vocal cords    Review of systems complete and found to be negative unless listed  above ROS: PHYSICAL EXAM BP 115/79  Pulse 62  Ht 5\' 7"  (1.702 m)  Wt 175 lb (79.379 kg)  BMI 27.41 kg/m2  General: Well developed, well nourished, in no acute distress Head: Eyes PERRLA, No xanthomas.   Normal cephalic and atramatic  Lungs: Clear bilaterally to auscultation and percussion. Heart: HRRR S1 S2, bradycardic .  Pulses are 2+ & equal.            No carotid bruit. No JVD.  No abdominal bruits. No femoral bruits. Abdomen: Bowel sounds are positive, abdomen soft and non-tender without masses or                  Hernia's noted. Msk:  Back normal, normal gait. Normal strength and tone for age. Extremities: No clubbing, cyanosis or edema.  DP +1 Neuro: Alert and oriented X 3. Psych:  Good affect, responds appropriately  EKG:  ASSESSMENT AND PLAN

## 2011-06-20 LAB — T3, FREE: T3, Free: 3.2 pg/mL (ref 2.3–4.2)

## 2011-06-20 LAB — T4, FREE: Free T4: 1.18 ng/dL (ref 0.80–1.80)

## 2011-06-21 ENCOUNTER — Encounter: Payer: Self-pay | Admitting: *Deleted

## 2011-07-03 ENCOUNTER — Other Ambulatory Visit: Payer: Self-pay | Admitting: *Deleted

## 2011-07-03 MED ORDER — LISINOPRIL-HYDROCHLOROTHIAZIDE 20-12.5 MG PO TABS: 1.0000 | ORAL_TABLET | Freq: Every day | ORAL | Status: DC

## 2011-07-24 ENCOUNTER — Ambulatory Visit: Payer: Medicare Other | Admitting: Cardiology

## 2011-09-03 ENCOUNTER — Other Ambulatory Visit: Payer: Self-pay

## 2011-09-03 DIAGNOSIS — E785 Hyperlipidemia, unspecified: Secondary | ICD-10-CM

## 2011-09-03 MED ORDER — ATORVASTATIN CALCIUM 20 MG PO TABS: 20.0000 mg | ORAL_TABLET | Freq: Every day | ORAL | Status: DC

## 2011-10-17 ENCOUNTER — Ambulatory Visit (HOSPITAL_COMMUNITY)
Admission: RE | Admit: 2011-10-17 | Discharge: 2011-10-17 | Disposition: A | Discharge status: Home / Self Care | Payer: Medicare Other | Source: Ambulatory Visit | Attending: Family Medicine | Admitting: Family Medicine

## 2011-10-17 ENCOUNTER — Other Ambulatory Visit (HOSPITAL_COMMUNITY): Payer: Self-pay | Admitting: Family Medicine

## 2011-10-17 DIAGNOSIS — M549 Dorsalgia, unspecified: Secondary | ICD-10-CM

## 2011-10-17 DIAGNOSIS — M25519 Pain in unspecified shoulder: Secondary | ICD-10-CM | POA: Insufficient documentation

## 2011-10-17 DIAGNOSIS — M542 Cervicalgia: Secondary | ICD-10-CM | POA: Insufficient documentation

## 2011-10-17 DIAGNOSIS — M546 Pain in thoracic spine: Secondary | ICD-10-CM | POA: Insufficient documentation

## 2011-10-18 ENCOUNTER — Other Ambulatory Visit: Payer: Self-pay | Admitting: Cardiology

## 2011-12-28 ENCOUNTER — Other Ambulatory Visit (HOSPITAL_COMMUNITY)
Admission: RE | Admit: 2011-12-28 | Discharge: 2011-12-28 | Disposition: A | Discharge status: Home / Self Care | Payer: Medicare Other | Source: Ambulatory Visit | Attending: Family Medicine | Admitting: Family Medicine

## 2011-12-28 DIAGNOSIS — D1801 Hemangioma of skin and subcutaneous tissue: Secondary | ICD-10-CM | POA: Insufficient documentation

## 2012-01-15 ENCOUNTER — Other Ambulatory Visit (HOSPITAL_COMMUNITY): Payer: Self-pay | Admitting: Family Medicine

## 2012-01-15 DIAGNOSIS — R109 Unspecified abdominal pain: Secondary | ICD-10-CM

## 2012-01-16 ENCOUNTER — Other Ambulatory Visit (HOSPITAL_COMMUNITY): Payer: Medicare Other

## 2012-01-17 ENCOUNTER — Ambulatory Visit (HOSPITAL_COMMUNITY)
Admission: RE | Admit: 2012-01-17 | Discharge: 2012-01-17 | Disposition: A | Discharge status: Home / Self Care | Payer: Medicare Other | Source: Ambulatory Visit | Attending: Family Medicine | Admitting: Family Medicine

## 2012-01-17 ENCOUNTER — Ambulatory Visit (HOSPITAL_COMMUNITY): Admission: RE | Admit: 2012-01-17 | Payer: Medicare Other | Source: Ambulatory Visit

## 2012-01-17 DIAGNOSIS — R1031 Right lower quadrant pain: Secondary | ICD-10-CM | POA: Insufficient documentation

## 2012-01-17 DIAGNOSIS — R109 Unspecified abdominal pain: Secondary | ICD-10-CM

## 2012-01-17 DIAGNOSIS — N400 Enlarged prostate without lower urinary tract symptoms: Secondary | ICD-10-CM | POA: Insufficient documentation

## 2012-01-17 DIAGNOSIS — K5730 Diverticulosis of large intestine without perforation or abscess without bleeding: Secondary | ICD-10-CM | POA: Insufficient documentation

## 2012-01-17 LAB — POCT I-STAT, CHEM 8
Calcium, Ion: 1.19 mmol/L (ref 1.13–1.30)
Chloride: 105 mEq/L (ref 96–112)
HCT: 46 % (ref 39.0–52.0)
TCO2: 25 mmol/L (ref 0–100)

## 2012-01-17 MED ORDER — IOHEXOL 300 MG/ML  SOLN: 100.0000 mL | Freq: Once | INTRAMUSCULAR | Status: AC | PRN

## 2012-01-17 NOTE — Progress Notes (Signed)
Blood sample obtained from right arm IV for Creatnine level.  

## 2012-01-29 ENCOUNTER — Other Ambulatory Visit: Payer: Self-pay | Admitting: Cardiology

## 2012-01-29 MED ORDER — METOPROLOL SUCCINATE ER 50 MG PO TB24: 50.0000 mg | ORAL_TABLET | Freq: Every day | ORAL | Status: DC

## 2012-01-29 MED ORDER — LISINOPRIL-HYDROCHLOROTHIAZIDE 20-12.5 MG PO TABS: 1.0000 | ORAL_TABLET | Freq: Every day | ORAL | Status: DC

## 2012-03-18 ENCOUNTER — Other Ambulatory Visit: Payer: Self-pay | Admitting: *Deleted

## 2012-03-18 DIAGNOSIS — E785 Hyperlipidemia, unspecified: Secondary | ICD-10-CM

## 2012-03-18 MED ORDER — METOPROLOL SUCCINATE ER 50 MG PO TB24: 50.0000 mg | ORAL_TABLET | Freq: Every day | ORAL | Status: DC

## 2012-03-18 MED ORDER — ATORVASTATIN CALCIUM 20 MG PO TABS: 20.0000 mg | ORAL_TABLET | Freq: Every day | ORAL | Status: DC

## 2012-06-12 ENCOUNTER — Encounter (HOSPITAL_COMMUNITY): Payer: Self-pay | Admitting: Pharmacy Technician

## 2012-06-12 ENCOUNTER — Encounter: Payer: Self-pay | Admitting: Urgent Care

## 2012-06-12 ENCOUNTER — Ambulatory Visit (INDEPENDENT_AMBULATORY_CARE_PROVIDER_SITE_OTHER): Payer: Medicare Other | Admitting: Urgent Care

## 2012-06-12 VITALS — BP 117/74 | HR 78 | Temp 98.4°F | Ht 67.0 in | Wt 174.6 lb

## 2012-06-12 DIAGNOSIS — R1011 Right upper quadrant pain: Secondary | ICD-10-CM

## 2012-06-12 DIAGNOSIS — K219 Gastro-esophageal reflux disease without esophagitis: Secondary | ICD-10-CM

## 2012-06-12 LAB — CBC WITH DIFFERENTIAL/PLATELET
Basophils Absolute: 0.1 10*3/uL (ref 0.0–0.1)
HCT: 41.9 % (ref 39.0–52.0)
Lymphocytes Relative: 24 % (ref 12–46)
Neutro Abs: 5.1 10*3/uL (ref 1.7–7.7)
Platelets: 188 10*3/uL (ref 150–400)
RDW: 14.4 % (ref 11.5–15.5)
WBC: 7.8 10*3/uL (ref 4.0–10.5)

## 2012-06-12 LAB — COMPREHENSIVE METABOLIC PANEL
ALT: 33 U/L (ref 0–53)
AST: 23 U/L (ref 0–37)
Albumin: 4.3 g/dL (ref 3.5–5.2)
Calcium: 9.6 mg/dL (ref 8.4–10.5)
Chloride: 104 mEq/L (ref 96–112)
Potassium: 4.4 mEq/L (ref 3.5–5.3)
Sodium: 139 mEq/L (ref 135–145)

## 2012-06-12 MED ORDER — PEG 3350-KCL-NA BICARB-NACL 420 G PO SOLR: 4000.0000 mL | ORAL | Status: DC

## 2012-06-12 NOTE — Patient Instructions (Addendum)
Colonoscopy & EGD with Dr Jena Gauss Please get your labs as soon as possible.  We will call you with results.

## 2012-06-12 NOTE — Progress Notes (Signed)
Referring Provider: Milana Obey, MD Primary Care Physician:  Milana Obey, MD Primary Gastroenterologist:  Dr. Jena Gauss  Chief Complaint  Patient presents with  . Abdominal Pain    RUQ    HPI:  Brent Brock is a 67 y.o. male here as a referral from Dr. Sudie Bailey for RUQ discomfort. Pain is 5/10 at worst for past 3 months.  Pain is constant, better with hydrocodone.   When he lies supine, feels a "knot".  Pain worst lying supine.  Better with activity.  Not affected by meals or bowel habits.  Hx GERD well controlled on Nexium 40mg  daily for years.  Occasionally skips doses.  Denies heartburn, indigestion, nausea, vomiting, dysphagia, odynophagia or anorexia.  No NSAIDS.   Denies constipation, diarrhea, rectal bleeding, melena or weight loss.  Labs 10/2011:  CBC,CMP, ESR, TSH normal  12/2011 CT A/P with IV contrast-1. No acute finding or finding to explain right lower quadrant  pain. 2. Sigmoid diverticulosis without diverticulitis. 3. Enlarged prostate gland.   Past Medical History  Diagnosis Date  . ASCVD (arteriosclerotic cardiovascular disease)      PTCA of LAD in 03/1998; recath a few weeks later-no restenosis; apical MI in 04/1998; restenosis of the LAD-stent placed; rotational atherectomy of first diagonal  . Hyperlipidemia   . Hypertension   . Past use of tobacco     40 pack years discontinued in 03/1998  . GERD (gastroesophageal reflux disease)   . Anxiety   . BPH (benign prostatic hyperplasia)   . Chronic prostatitis   . Diverticulosis     Past Surgical History  Procedure Laterality Date  . Polypectomy  1997    Polyps removed on vocal cords   . Coronary stent placement      x 2    Current Outpatient Prescriptions  Medication Sig Dispense Refill  . aspirin 81 MG tablet Take 81 mg by mouth daily.        Marland Kitchen atorvastatin (LIPITOR) 20 MG tablet Take 1 tablet (20 mg total) by mouth at bedtime.  90 tablet  3  . ciprofloxacin (CIPRO) 500 MG tablet Take 500 mg by  mouth 2 (two) times daily.       Marland Kitchen esomeprazole (NEXIUM) 40 MG capsule Take 40 mg by mouth daily before breakfast.      . lisinopril-hydrochlorothiazide (PRINZIDE,ZESTORETIC) 20-12.5 MG per tablet Take 1 tablet by mouth daily.  30 tablet  6  . metoprolol succinate (TOPROL-XL) 50 MG 24 hr tablet Take 1 tablet (50 mg total) by mouth daily.  90 tablet  3  . NITROSTAT 0.4 MG SL tablet PLACE 1 TABLET UNDER TONGUE EVERY 5     MINUTES FOR 3 DOSES AS NEEDED.  25 tablet  3  . VENTOLIN HFA 108 (90 BASE) MCG/ACT inhaler Inhale 2 puffs into the lungs as needed.       No current facility-administered medications for this visit.    Allergies as of 06/12/2012 - Review Complete 06/12/2012  Allergen Reaction Noted  . Sulfa antibiotics Rash 06/12/2012    Family History:There is no known family history of colorectal carcinoma , liver disease, or inflammatory bowel disease.  Problem Relation Age of Onset  . Breast cancer Daughter     93's  . CAD      History   Social History  . Marital Status: Married    Spouse Name: N/A    Number of Children: N/A  . Years of Education: N/A   Occupational History  . Professor  [employer]   Social History Main Topics  . Smoking status: Former Smoker -- 1.00 packs/day for 30 years    Types: Pipe    Quit date: 06/21/1998  . Smokeless tobacco: Never Used  . Alcohol Use: No  . Drug Use: No  . Sexually Active: Not on file   Other Topics Concern  . Not on file   Social History Narrative   Lives w/ Wife   Lost only daughter from metastatic breast cancer     Review of Systems: Gen: Denies any fever, chills, sweats, anorexia, fatigue, weakness, malaise, weight loss, and sleep disorder CV: Denies chest pain, angina, palpitations, syncope, orthopnea, PND, peripheral edema, and claudication. Resp: Denies dyspnea at rest, dyspnea with exercise, cough, sputum, wheezing, coughing up blood, and pleurisy. GI: Denies vomiting blood, jaundice, and  fecal incontinence.    GU : Denies urinary burning, blood in urine, urinary frequency, urinary hesitancy, nocturnal urination, and urinary incontinence. MS: Denies joint pain, limitation of movement, and swelling, stiffness, low back pain, extremity pain. Denies muscle weakness, cramps, atrophy.  Derm: Denies rash, itching, dry skin, hives, moles, warts, or unhealing ulcers.  Psych: Denies depression, anxiety, memory loss, suicidal ideation, hallucinations, paranoia, and confusion. Heme: Denies bruising, bleeding, and enlarged lymph nodes. Neuro:  Denies any headaches, dizziness, paresthesias. Endo:  Denies any problems with DM, thyroid, adrenal function.  Physical Exam: BP 117/74  Pulse 78  Temp(Src) 98.4 F (36.9 C) (Oral)  Ht 5\' 7"  (1.702 m)  Wt 174 lb 9.6 oz (79.198 kg)  BMI 27.34 kg/m2 No LMP for male patient. General:   Alert,  Well-developed, well-nourished, pleasant and cooperative in NAD Head:  Normocephalic and atraumatic. Eyes:  Sclera clear, no icterus.   Conjunctiva pink. Ears:  Normal auditory acuity. Nose:  No deformity, discharge, or lesions. Mouth:  No deformity or lesions,oropharynx pink & moist. Neck:  Supple; no masses or thyromegaly. Lungs:  Clear throughout to auscultation.   No wheezes, crackles, or rhonchi. No acute distress. Heart:  Regular rate and rhythm; no murmurs, clicks, rubs,  or gallops. Abdomen:  +Diastasis recti. Normal bowel sounds.  No bruits.  Soft, non-tender and non-distended without masses, hepatosplenomegaly or hernias noted.  No guarding or rebound tenderness.   Rectal:  Deferred. Msk:  Symmetrical without gross deformities. Normal posture. Pulses:  Normal pulses noted. Extremities:  No clubbing or edema. Neurologic:  Alert and oriented x4;  grossly normal neurologically. Skin:  Intact without significant lesions or rashes. Lymph Nodes:  No significant cervical adenopathy. Psych:  Alert and cooperative. Normal mood and affect.

## 2012-06-12 NOTE — Assessment & Plan Note (Addendum)
Brent Brock. is a pleasant 67 y.o. male with RUQ discomfort not associated with nausea, vomiting, eating, change in bowel habits or movement.  Worse with rest.  Etiology is unclear.  CT A/P benign last Fall.  Screening colonoscopy & EGD given hx chronic GERD with Dr Jena Gauss.  If nothing to explain pain, consider repeating CT.  I have discussed risks & benefits which include, but are not limited to, bleeding, infection, perforation & drug reaction.  The patient agrees with this plan & written consent will be obtained.    CBC,CMP, lipase

## 2012-06-12 NOTE — Assessment & Plan Note (Signed)
On Nexium 40mg  daily chronically.  Needs EGD for screening for Barrett's & diagnostic purposes for upper abdominal pain.  Differentials include GERD, gastritis,PUD or malignancy.  I have discussed risks & benefits which include, but are not limited to, bleeding, infection, perforation & drug reaction.  The patient agrees with this plan & written consent will be obtained.

## 2012-06-12 NOTE — Progress Notes (Signed)
Quick Note:  Please let pt know blood ct, electrolytes, liver normal. Keep procedures as planned. ZO:[id number] D, MD  ______

## 2012-06-13 NOTE — Progress Notes (Signed)
Faxed to PCP

## 2012-06-16 LAB — CBC
HCT: 43 %
Hemoglobin: 14.3 g/dL (ref 13.5–17.5)

## 2012-06-16 LAB — COMPREHENSIVE METABOLIC PANEL
Albumin: 4
Calcium: 9.1 mg/dL
Chloride: 26 mmol/L
TSH: 4.42
Total Bilirubin: 4.4 mg/dL

## 2012-06-16 NOTE — Progress Notes (Signed)
Results faxed to PCP 

## 2012-06-16 NOTE — Progress Notes (Signed)
Quick Note:  pts wife is aware.  Dawn, please cc pcp ______

## 2012-06-18 ENCOUNTER — Telehealth: Payer: Self-pay | Admitting: Internal Medicine

## 2012-06-18 NOTE — Telephone Encounter (Signed)
Done

## 2012-06-18 NOTE — Telephone Encounter (Signed)
Pt has an infected wisdom tooth and wants to cancel his tcs/egd for tomorrow. He doesn't think he can handle it at this time and will [facility] later. He is aware if it goes over 30 days from his OV that he will need to come back to the office. Florian Buff is aware.

## 2012-06-19 ENCOUNTER — Encounter (HOSPITAL_COMMUNITY): Admission: RE | Payer: Self-pay | Source: Ambulatory Visit

## 2012-06-19 ENCOUNTER — Ambulatory Visit (HOSPITAL_COMMUNITY): Admission: RE | Admit: 2012-06-19 | Payer: Medicare Other | Source: Ambulatory Visit | Admitting: Internal Medicine

## 2012-06-19 SURGERY — COLONOSCOPY WITH ESOPHAGOGASTRODUODENOSCOPY (EGD)
Anesthesia: Moderate Sedation

## 2012-07-07 ENCOUNTER — Ambulatory Visit: Payer: BC Managed Care – PPO | Admitting: Cardiology

## 2012-07-09 ENCOUNTER — Encounter: Payer: Self-pay | Admitting: Gastroenterology

## 2012-07-09 ENCOUNTER — Ambulatory Visit (INDEPENDENT_AMBULATORY_CARE_PROVIDER_SITE_OTHER): Payer: Medicare Other | Admitting: Gastroenterology

## 2012-07-09 VITALS — BP 132/79 | HR 69 | Temp 97.2°F | Ht 65.0 in | Wt 173.0 lb

## 2012-07-09 DIAGNOSIS — Z1211 Encounter for screening for malignant neoplasm of colon: Secondary | ICD-10-CM

## 2012-07-09 DIAGNOSIS — R1011 Right upper quadrant pain: Secondary | ICD-10-CM

## 2012-07-09 MED ORDER — PEG 3350-KCL-NA BICARB-NACL 420 G PO SOLR: 4000.0000 mL | ORAL | Status: DC

## 2012-07-09 NOTE — Patient Instructions (Addendum)
We have scheduled you for a colonoscopy and possible upper endoscopy if the ultrasound is negative.  We have also scheduled the ultrasound of your abdomen. Further recommendations in the near future.

## 2012-07-09 NOTE — Progress Notes (Signed)
Referring Provider: Milana Obey, MD Primary Care Physician:  Milana Obey, MD Primary GI: Dr. Jena Gauss    Chief Complaint  Patient presents with  . Follow-up    HPI:   67 year old male presenting today for an updated H&P prior to TCS/EGD with Dr. Jena Gauss. He was originally seen in March 2014 for RUQ discomfort for the past several months, not related to eating, drinking, or bowel habits. GERD controlled with Nexium. No changes in bowel habits.  States he feels like RUQ is more of a discomfort, always there. Doesn't want an EGD.  States pain wouldn't even be a 3/10 on the pain scale. Hurts worse when laying on his back. Believes his last colonoscopy was at least 20 years ago.   Past Medical History  Diagnosis Date  . ASCVD (arteriosclerotic cardiovascular disease)      PTCA of LAD in 03/1998; recath a few weeks later-no restenosis; apical MI in 04/1998; restenosis of the LAD-stent placed; rotational atherectomy of first diagonal  . Hyperlipidemia   . Hypertension   . Past use of tobacco     40 pack years discontinued in 03/1998  . GERD (gastroesophageal reflux disease)   . Anxiety   . BPH (benign prostatic hyperplasia)   . Chronic prostatitis   . Diverticulosis     Past Surgical History  Procedure Laterality Date  . Polypectomy  1997    Polyps removed on vocal cords   . Coronary stent placement      x 2    Current Outpatient Prescriptions  Medication Sig Dispense Refill  . aspirin 81 MG tablet Take 81 mg by mouth daily.        Marland Kitchen atorvastatin (LIPITOR) 20 MG tablet Take 1 tablet (20 mg total) by mouth at bedtime.  90 tablet  3  . esomeprazole (NEXIUM) 40 MG capsule Take 40 mg by mouth daily as needed (Acid Reflux).       Marland Kitchen lisinopril-hydrochlorothiazide (PRINZIDE,ZESTORETIC) 20-12.5 MG per tablet Take 1 tablet by mouth daily.  30 tablet  6  . metoprolol succinate (TOPROL-XL) 50 MG 24 hr tablet Take 50 mg by mouth daily.      . nitroGLYCERIN (NITROSTAT) 0.4 MG SL tablet  Place 0.4 mg under the tongue every 5 (five) minutes as needed for chest pain.      Bertram Gala Glycol-Propyl Glycol (SYSTANE OP) Apply 1 drop to eye daily as needed (Dry Eyes).      . VENTOLIN HFA 108 (90 BASE) MCG/ACT inhaler Inhale 2 puffs into the lungs as needed for wheezing or shortness of breath.       . ciprofloxacin (CIPRO) 500 MG tablet Take 500 mg by mouth 2 (two) times daily.       . polyethylene glycol-electrolytes (TRILYTE) 420 G solution Take 4,000 mLs by mouth as directed.  4000 mL  0   No current facility-administered medications for this visit.    Allergies as of 07/09/2012 - Review Complete 07/09/2012  Allergen Reaction Noted  . Sulfa antibiotics Rash 06/12/2012    Family History  Problem Relation Age of Onset  . Breast cancer Daughter     69's  . CAD      History   Social History  . Marital Status: Married    Spouse Name: N/A    Number of Children: N/A  . Years of Education: N/A   Occupational History  . Professor     [employer]   Social History Main Topics  . Smoking status:  Former Smoker -- 1.00 packs/day for 30 years    Types: Pipe    Quit date: 06/21/1998  . Smokeless tobacco: Never Used  . Alcohol Use: No  . Drug Use: No  . Sexually Active: None   Other Topics Concern  . 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 Denies fatigue, weakness, weight loss.  CV: Denies chest pain, palpitations, syncope, peripheral edema, and claudication. Resp: +cough a few months ago GI: Denies vomiting blood, jaundice, and fecal incontinence.   Denies dysphagia or odynophagia. Derm: Denies rash, itching, dry skin Psych: Denies depression, anxiety, memory loss, confusion. No homicidal or suicidal ideation.  Heme: Denies bruising, bleeding, and enlarged lymph nodes.  Physical Exam: BP 132/79   Pulse 69  Temp(Src) 97.2 F (36.2 C) (Oral)  Ht 5\' 5"  (1.651 m)  Wt 173 lb (78.472 kg)  BMI 28.79 kg/m2 General:   Alert and oriented. No distress noted. Pleasant and cooperative.  Head:  Normocephalic and atraumatic. Eyes:  Conjuctiva clear without scleral icterus. Mouth:  Oral mucosa pink and moist. Good dentition. No lesions. Neck:  Supple, without mass or thyromegaly. Heart:  S1, S2 present without murmurs, rubs, or gallops. Regular rate and rhythm. Abdomen:  +BS, soft, non-tender and non-distended. No rebound or guarding. Rectus diastasis noted Msk:  Symmetrical without gross deformities. Normal posture. Extremities:  Without edema. Neurologic:  Alert and  oriented x4;  grossly normal neurologically. Skin:  Intact without significant lesions or rashes. Cervical Nodes:  No significant cervical adenopathy. Psych:  Alert and cooperative. Normal mood and affect.  Lab Results  Component Value Date   LIPASE 15 06/12/2012   Lab Results  Component Value Date   WBC 7.8 06/12/2012   HGB 15.0 06/12/2012   HCT 41.9 06/12/2012   MCV 83.8 06/12/2012   PLT 188 06/12/2012   Lab Results  Component Value Date   ALT 33 06/12/2012   AST 23 06/12/2012   ALKPHOS 94 06/12/2012   BILITOT 0.9 06/12/2012

## 2012-07-10 DIAGNOSIS — Z1211 Encounter for screening for malignant neoplasm of colon: Secondary | ICD-10-CM | POA: Insufficient documentation

## 2012-07-10 NOTE — Assessment & Plan Note (Signed)
67 year old male with need for updated screening colonoscopy; he reports his last lower GI evaluation was at least 20 years ago. He has no concerning signs such as change in bowel habits, rectal bleeding, unexplained weight loss.   Proceed with TCS with Dr. Jena Gauss in near future: the risks, benefits, and alternatives have been discussed with the patient in detail. The patient states understanding and desires to proceed. Possible EGD at time of TCS for persistent RUQ pain (see RUQ pain)

## 2012-07-10 NOTE — Assessment & Plan Note (Signed)
Persistent RUQ discomfort, without aggravating factors, association with eating/drinking, or bowel habits. GERD symptoms controlled with Nexium. When he was originally seen, an EGD was offered for further evaluation, as prior CBC, HFP, lipase, and CT were unrevealing. Gallbladder DOES remain in situ. He would rather not pursue an EGD UNLESS absolutely necessary. He is requesting an [place] of abdomen prior to consideration of EGD. I think this is reasonable. Could very well be dealing with occult gallbladder disease but unable to rule out gastritis, PUD.   Proceed with [place] of abdomen in very near future. Possible EGD at time of TCS after review of [place]. Patient is agreeable to EGD if necessary and [place] of abd unrevealing. I discussed this stepwise approach with him, and he understands.

## 2012-07-10 NOTE — Progress Notes (Signed)
Cc PCP 

## 2012-07-15 ENCOUNTER — Ambulatory Visit (HOSPITAL_COMMUNITY)
Admission: RE | Admit: 2012-07-15 | Discharge: 2012-07-15 | Disposition: A | Discharge status: Home / Self Care | Payer: Medicare Other | Source: Ambulatory Visit | Attending: Gastroenterology | Admitting: Gastroenterology

## 2012-07-15 DIAGNOSIS — R1011 Right upper quadrant pain: Secondary | ICD-10-CM

## 2012-07-15 DIAGNOSIS — R1031 Right lower quadrant pain: Secondary | ICD-10-CM | POA: Insufficient documentation

## 2012-07-18 ENCOUNTER — Encounter (HOSPITAL_COMMUNITY): Payer: Self-pay | Admitting: Pharmacy Technician

## 2012-07-24 ENCOUNTER — Telehealth: Payer: Self-pay | Admitting: Internal Medicine

## 2012-07-24 NOTE — Telephone Encounter (Signed)
Reminder in epic to follow up with patient in 3 months to see what he wants to do

## 2012-07-24 NOTE — Telephone Encounter (Signed)
Pt came by office to cancel his TCS for 5/8 with RMR. He is trying to work around his wife's chemo schedule and he feels he needs to put her needs first at this time. He said that he will be calling us back to [facility] later on.

## 2012-07-24 NOTE — Telephone Encounter (Signed)
Let's touch base with him in 3 months if we have not heard from him by that time

## 2012-07-24 NOTE — Telephone Encounter (Signed)
Done

## 2012-07-30 ENCOUNTER — Ambulatory Visit (INDEPENDENT_AMBULATORY_CARE_PROVIDER_SITE_OTHER): Payer: Medicare Other | Admitting: Cardiology

## 2012-07-30 ENCOUNTER — Encounter: Payer: Self-pay | Admitting: Cardiology

## 2012-07-30 VITALS — BP 133/92 | HR 75 | Ht 65.0 in | Wt 176.1 lb

## 2012-07-30 DIAGNOSIS — K219 Gastro-esophageal reflux disease without esophagitis: Secondary | ICD-10-CM

## 2012-07-30 DIAGNOSIS — I2510 Atherosclerotic heart disease of native coronary artery without angina pectoris: Secondary | ICD-10-CM

## 2012-07-30 DIAGNOSIS — I10 Essential (primary) hypertension: Secondary | ICD-10-CM

## 2012-07-30 DIAGNOSIS — E785 Hyperlipidemia, unspecified: Secondary | ICD-10-CM

## 2012-07-30 DIAGNOSIS — F411 Generalized anxiety disorder: Secondary | ICD-10-CM

## 2012-07-30 MED ORDER — LISINOPRIL-HYDROCHLOROTHIAZIDE 20-12.5 MG PO TABS: 2.0000 | ORAL_TABLET | Freq: Every day | ORAL | Status: DC

## 2012-07-30 NOTE — Progress Notes (Signed)
Quick Note:  Normal liver, no gallstones. We can offer a HIDA scan for further evaluation of RUQ pain.  Thus far, labs unrevealing.   ______

## 2012-07-30 NOTE — Patient Instructions (Addendum)
Your physician recommends that you schedule a follow-up appointment in: ONE YEAR  Your physician has requested that you regularly monitor and record your blood pressure readings at home. Please use the same machine at the same time of day to check your readings and record them to bring to your follow-up visit.  Your physician recommends that you return for lab work in: ONE MONTH (BMET) Your physician recommends that you have follow up lab work, we will mail you a reminder letter to alert you when to go [employer], located across the street from our office.   Your physician has recommended you make the following change in your medication:   1) LISINOPRIL/HCTZ 40/25 (TWO TABLETS DAILY)

## 2012-07-30 NOTE — Progress Notes (Deleted)
Name: Brent Brock.    DOB: 1946/03/12  Age: 68 y.o.  MR#: [id number]       PCP:  Milana Obey, MD      Insurance: Payor: [employer] MEDICARE  Plan: AARP MEDICARE COMPLETE  Product Type: *No Product type*    CC:   No chief complaint on file.  list VS Filed Vitals:   07/30/12 1357  BP: 133/92  Pulse: 75  Height: 5\' 5"  (1.651 m)  Weight: 176 lb 1.9 oz (79.888 kg)    Weights Current Weight  07/30/12 176 lb 1.9 oz (79.888 kg)  07/09/12 173 lb (78.472 kg)  06/12/12 174 lb 9.6 oz (79.198 kg)    Blood Pressure  BP Readings from Last 3 Encounters:  07/30/12 133/92  07/09/12 132/79  06/12/12 117/74     Admit date:  (Not on file) Last encounter with RMR:  07/07/2012   Allergy Sulfa antibiotics  Current Outpatient Prescriptions  Medication Sig Dispense Refill  . aspirin 81 MG tablet Take 81 mg by mouth daily.        Marland Kitchen atorvastatin (LIPITOR) 20 MG tablet Take 1 tablet (20 mg total) by mouth at bedtime.  90 tablet  3  . esomeprazole (NEXIUM) 40 MG capsule Take 40 mg by mouth daily as needed (Acid Reflux).       Marland Kitchen lisinopril-hydrochlorothiazide (PRINZIDE,ZESTORETIC) 20-12.5 MG per tablet Take 1 tablet by mouth daily.  30 tablet  6  . metoprolol succinate (TOPROL-XL) 50 MG 24 hr tablet Take 50 mg by mouth daily.      . nitroGLYCERIN (NITROSTAT) 0.4 MG SL tablet Place 0.4 mg under the tongue every 5 (five) minutes as needed for chest pain.       No current facility-administered medications for this visit.    Discontinued Meds:    Medications Discontinued During This Encounter  Medication Reason  . Polyethyl Glycol-Propyl Glycol (SYSTANE OP) Error  . polyethylene glycol-electrolytes (TRILYTE) 420 G solution Error  . VENTOLIN HFA 108 (90 BASE) MCG/ACT inhaler Change in therapy    Patient Active Problem List   Diagnosis Date Noted  . Encounter for screening colonoscopy 07/10/2012  . RUQ pain 06/12/2012  . Past use of tobacco   . HYPERLIPIDEMIA 06/02/2009  .  ANXIETY 06/02/2009  . HYPERTENSION 06/02/2009  . ATHEROSCLEROTIC CARDIOVASCULAR DISEASE 06/02/2009  . GERD 06/02/2009    LABS    Component Value Date/Time   NA 139 06/12/2012 1220   NA 139 01/17/2012 1314   NA 144 11/12/2011   NA 144 05/19/2009   K 4.4 06/12/2012 1220   K 3.8 01/17/2012 1314   K 4.0 11/12/2011   K 4.8 05/19/2009   CL 104 06/12/2012 1220   CL 105 01/17/2012 1314   CL 26 11/12/2011   CL 105 05/19/2009   CO2 27 06/12/2012 1220   CO2 27 05/19/2009   GLUCOSE 112* 06/12/2012 1220   GLUCOSE 88 01/17/2012 1314   BUN 13 06/12/2012 1220   BUN 27* 01/17/2012 1314   BUN 14 11/12/2011   BUN 15 05/19/2009   CREATININE 1.08 06/12/2012 1220   CREATININE 1.20 01/17/2012 1314   CREATININE 1.20 11/12/2011   CREATININE 1.04 05/19/2009   CALCIUM 9.6 06/12/2012 1220   CALCIUM 9.1 11/12/2011   CALCIUM 9.3 05/19/2009   CMP     Component Value Date/Time   NA 139 06/12/2012 1220   NA 144 11/12/2011   K 4.4 06/12/2012 1220   K 4.0 11/12/2011   CL 104 06/12/2012  1220   CL 26 11/12/2011   CO2 27 06/12/2012 1220   GLUCOSE 112* 06/12/2012 1220   BUN 13 06/12/2012 1220   BUN 14 11/12/2011   CREATININE 1.08 06/12/2012 1220   CREATININE 1.20 01/17/2012 1314   CALCIUM 9.6 06/12/2012 1220   CALCIUM 9.1 11/12/2011   PROT 6.8 06/12/2012 1220   PROT 6.9 11/12/2011   ALBUMIN 4.3 06/12/2012 1220   AST 23 06/12/2012 1220   AST 20 11/12/2011   ALT 33 06/12/2012 1220   ALKPHOS 94 06/12/2012 1220   ALKPHOS 95 11/12/2011   BILITOT 0.9 06/12/2012 1220   BILITOT 4.4 11/12/2011       Component Value Date/Time   WBC 7.8 06/12/2012 1220   WBC 9.1 11/12/2011   WBC 13.3* 04/05/2008 0939   HGB 15.0 06/12/2012 1220   HGB 15.6 01/17/2012 1314   HGB 14.3 11/12/2011   HCT 41.9 06/12/2012 1220   HCT 46.0 01/17/2012 1314   HCT 43 11/12/2011   HCT 39.2 04/05/2008 0939   MCV 83.8 06/12/2012 1220   MCV 88.5 04/05/2008 0939    Lipid Panel     Component Value Date/Time   CHOL 124 05/19/2009   TRIG 98 09/10/2008   HDL 39 05/19/2009    LDLCALC 52 05/19/2009    ABG    Component Value Date/Time   TCO2 25 01/17/2012 1314     Lab Results  Component Value Date   TSH 4.42 11/12/2011   BNP (last 3 results) No results found for this basename: PROBNP,  in the last 8760 hours Cardiac Panel (last 3 results) No results found for this basename: CKTOTAL, CKMB, TROPONINI, RELINDX,  in the last 72 hours  Iron/TIBC/Ferritin No results found for this basename: iron, tibc, ferritin     EKG Orders placed in visit on 07/30/12  . EKG 12-LEAD     Prior Assessment and Plan Problem List as of 07/30/2012     ICD-9-CM   HYPERLIPIDEMIA   Last Assessment & Plan   06/19/2011 Office Visit Written 06/19/2011  3:04 PM by Jodelle Gross, NP     TC 123, TG 68, HDL 35, LDL 74.  He is on fish oil and lovastatin. LFT's are not evaluated on current labs.  I have encouraged him to exercise more if possible.      ANXIETY   HYPERTENSION   Last Assessment & Plan   06/21/2010 Office Visit Written 06/21/2010  4:23 PM by Kathlen Brunswick, MD     Blood pressure remains excellent; current medications will be continued.    ATHEROSCLEROTIC CARDIOVASCULAR DISEASE   Last Assessment & Plan   06/19/2011 Office Visit Written 06/19/2011  3:02 PM by Jodelle Gross, NP     He is without cardiac complaint at this time. He remains active. Labs have been drawn per [facility] with mildly elevated TSH He wishes to discuss this with Dr. Dietrich Pates further. May need to have T3 and T4.     GERD   Last Assessment & Plan   06/12/2012 Office Visit Written 06/12/2012  8:12 PM by Joselyn Arrow, NP     On Nexium 40mg  daily chronically.  Needs EGD for screening for Barrett's & diagnostic purposes for upper abdominal pain.  Differentials include GERD, gastritis,PUD or malignancy.  I have discussed risks & benefits which include, but are not limited to, bleeding, infection, perforation & drug reaction.  The patient agrees with this plan & written consent will be obtained.  Past use of tobacco   RUQ pain   Last Assessment & Plan   07/09/2012 Office Visit Written 07/10/2012  1:21 PM by Nira Retort, NP     Persistent RUQ discomfort, without aggravating factors, association with eating/drinking, or bowel habits. GERD symptoms controlled with Nexium. When he was originally seen, an EGD was offered for further evaluation, as prior CBC, HFP, lipase, and CT were unrevealing. Gallbladder DOES remain in situ. He would rather not pursue an EGD UNLESS absolutely necessary. He is requesting an [place] of abdomen prior to consideration of EGD. I think this is reasonable. Could very well be dealing with occult gallbladder disease but unable to rule out gastritis, PUD.   Proceed with [place] of abdomen in very near future. Possible EGD at time of TCS after review of [place]. Patient is agreeable to EGD if necessary and [place] of abd unrevealing. I discussed this stepwise approach with him, and he understands.     Encounter for screening colonoscopy   Last Assessment & Plan   07/09/2012 Office Visit Written 07/10/2012  1:18 PM by Nira Retort, NP     67 year old male with need for updated screening colonoscopy; he reports his last lower GI evaluation was at least 20 years ago. He has no concerning signs such as change in bowel habits, rectal bleeding, unexplained weight loss.   Proceed with TCS with Dr. Jena Gauss in near future: the risks, benefits, and alternatives have been discussed with the patient in detail. The patient states understanding and desires to proceed. Possible EGD at time of TCS for persistent RUQ pain (see RUQ pain)        Imaging: US Abdomen Limited Ruq  07/15/2012  *RADIOLOGY REPORT*  Clinical Data: Pain  LIMITED RIGHT UPPER QUADRANT ULTRASOUND  Comparison: None.  Findings: No gallstones, pericholecystic fluid, or wall thickening.  The common bile duct is normal in caliber.  The liver is normal in echogenicity and without focal mass.  IMPRESSION:  No acute pathology involving the  gallbladder and liver.   Original Report Authenticated By: Jolaine Click, M.D.

## 2012-07-30 NOTE — Progress Notes (Signed)
Patient ID: Brent Brock., male   DOB: 25-Jan-1946, 67 y.o.   MRN: [id number]  HPI: Schedule return visit for this delightful gentleman who has done extremely well following remote myocardial infarction. 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 He has experienced increased stress related to his wife's poor health. She requires a great deal of care, and he finds that he does not exercise as much as previously. He's had no new medical problems other than transient right-sided abdominal pain for which no etiology was identified. He has not required urgent medical care in recent years. He has monitored blood pressure carefully at home. Systolics are typically less than 150 and mostly less than 140, but diastolics of 90-100 are recorded approximately 1/3 at that time.  Current Outpatient Prescriptions  Medication Sig Dispense Refill  . aspirin 81 MG tablet Take 81 mg by mouth daily.        Marland Kitchen atorvastatin (LIPITOR) 20 MG tablet Take 1 tablet (20 mg total) by mouth at bedtime.  90 tablet  3  . esomeprazole (NEXIUM) 40 MG capsule Take 40 mg by mouth daily as needed (Acid Reflux).       Marland Kitchen lisinopril-hydrochlorothiazide (PRINZIDE,ZESTORETIC) 20-12.5 MG per tablet Take 1 tablet by mouth daily.  30 tablet  6  . metoprolol succinate (TOPROL-XL) 50 MG 24 hr tablet Take 50 mg by mouth daily.      . nitroGLYCERIN (NITROSTAT) 0.4 MG SL tablet Place 0.4 mg under the tongue every 5 (five) minutes as needed for chest pain.       No current facility-administered medications for this visit.   Allergies  Allergen Reactions  . Sulfa Antibiotics Rash     Past medical history, social history, and family history reviewed and updated.  ROS: Denies chest pain, dyspnea, orthopnea, pedal edema, palpitations, lightheadedness or syncope. He has not had colonoscopy for 25 years, but plans a screening study in the near future with Dr. Jena Gauss.  PHYSICAL EXAM: BP 133/92  Pulse 75  Ht 5\' 5"  (1.651  m)  Wt 79.888 kg (176 lb 1.9 oz)  BMI 29.31 kg/m2;  Body mass index is 29.31 kg/(m^2). General-Well developed; no acute distress; hair piece Body habitus-proportionate weight and height Neck-No JVD; no carotid bruits Lungs-clear lung fields; resonant to percussion Cardiovascular-normal PMI; normal S1 and S2 Abdomen-normal bowel sounds; soft and non-tender without masses or organomegaly Musculoskeletal-No deformities, no cyanosis or clubbing Neurologic-Normal cranial nerves; symmetric strength and tone Skin-Warm, no significant lesions Extremities-distal pulses intact; no edema  EKG: Normal sinus rhythm; within normal limits.   Cresbard Bing, MD 07/30/2012  2:31 PM  ASSESSMENT AND PLAN

## 2012-07-31 ENCOUNTER — Encounter (HOSPITAL_COMMUNITY): Admission: RE | Payer: Self-pay | Source: Ambulatory Visit

## 2012-07-31 ENCOUNTER — Ambulatory Visit (HOSPITAL_COMMUNITY): Admission: RE | Admit: 2012-07-31 | Payer: Medicare Other | Source: Ambulatory Visit | Admitting: Internal Medicine

## 2012-07-31 SURGERY — COLONOSCOPY
Anesthesia: Moderate Sedation

## 2012-08-04 NOTE — Assessment & Plan Note (Signed)
Blood pressure has been mildly elevated of late. Dose of lisinopril/HCT will be increased with further monitoring of blood pressure, serum electrolytes and renal function.

## 2012-08-04 NOTE — Assessment & Plan Note (Signed)
Lipid profile has been excellent. More recent values will be obtained from patient's PCP.

## 2012-08-04 NOTE — Assessment & Plan Note (Signed)
Patient has done extremely well since percutaneous intervention 14 years ago.  Modification of risk factors as apparently resulted in a very benign course. We will continue to pursue these efforts.

## 2012-08-06 NOTE — Progress Notes (Signed)
Quick Note:  Tried to call pt- he was not home, LM with male to return call ______

## 2012-08-07 ENCOUNTER — Encounter: Payer: Self-pay | Admitting: Cardiology

## 2012-08-15 ENCOUNTER — Telehealth: Payer: Self-pay | Admitting: *Deleted

## 2012-08-15 DIAGNOSIS — E782 Mixed hyperlipidemia: Secondary | ICD-10-CM

## 2012-08-15 NOTE — Telephone Encounter (Signed)
Pt came in with wife to OV, Dr RR gave written orders for pt to have FLP, printed and gave lab orders to pt, will contact with results, pt understood

## 2012-08-16 LAB — LIPID PANEL
Cholesterol: 122 mg/dL (ref 0–200)
HDL: 29 mg/dL — ABNORMAL LOW (ref >39)
Triglycerides: 99 mg/dL (ref <150)
VLDL: 20 mg/dL (ref 0–40)

## 2012-08-17 ENCOUNTER — Encounter: Payer: Self-pay | Admitting: Cardiology

## 2012-08-19 ENCOUNTER — Encounter: Payer: Self-pay | Admitting: *Deleted

## 2012-08-19 ENCOUNTER — Telehealth: Payer: Self-pay

## 2012-08-19 ENCOUNTER — Other Ambulatory Visit: Payer: Self-pay | Admitting: Gastroenterology

## 2012-08-19 DIAGNOSIS — R1011 Right upper quadrant pain: Secondary | ICD-10-CM

## 2012-08-19 NOTE — Telephone Encounter (Signed)
Was unable to reach pt to inform him of his U/S results. Had mailed him a letter to call the office. He called the office this am and was given AS recommendations. Pt is agreeable to HIDA scan.   Benedetto Goad please schedule (see last U/S results) he said he is available Thursday afternoon and all day on Friday.  He can be reached on his cell at [phone]

## 2012-08-19 NOTE — Telephone Encounter (Signed)
Patient is scheduled for HIDA on Friday May 30th at 8:00 NPO after midnight

## 2012-08-20 ENCOUNTER — Telehealth: Payer: Self-pay | Admitting: Cardiology

## 2012-08-20 NOTE — Telephone Encounter (Signed)
Results of labwork / tgs  °

## 2012-08-20 NOTE — Telephone Encounter (Signed)
Spoke to pt to advise results/instructions. Pt understood.  

## 2012-08-22 ENCOUNTER — Encounter (HOSPITAL_COMMUNITY): Payer: Self-pay

## 2012-08-22 ENCOUNTER — Encounter (HOSPITAL_COMMUNITY)
Admission: RE | Admit: 2012-08-22 | Discharge: 2012-08-22 | Disposition: A | Discharge status: Home / Self Care | Payer: Medicare Other | Source: Ambulatory Visit | Attending: Gastroenterology | Admitting: Gastroenterology

## 2012-08-22 DIAGNOSIS — R1011 Right upper quadrant pain: Secondary | ICD-10-CM | POA: Insufficient documentation

## 2012-08-22 MED ORDER — TECHNETIUM TC 99M MEBROFENIN IV KIT
5.0000 | PACK | Freq: Once | INTRAVENOUS | Status: AC | PRN
  Administered 2012-08-22: 5 via INTRAVENOUS

## 2012-08-22 MED ORDER — SINCALIDE 5 MCG IJ SOLR
INTRAMUSCULAR | Status: AC
  Administered 2012-08-22: 1.59 ug
  Filled 2012-08-22: qty 5

## 2012-08-26 ENCOUNTER — Telehealth: Payer: Self-pay

## 2012-08-26 NOTE — Telephone Encounter (Signed)
I'm reviewing with Dr. Jena Gauss. His gallbladder function was normal. However, one of the sphincters that lets digestive juice into the small intestine showed "spasms" during the test. From what I saw on the report, he had no pain with the test. I am reviewing with Dr. Jena Gauss before final recommendations.

## 2012-08-26 NOTE — Telephone Encounter (Signed)
Pt called today wanting the results from the HIDA scan. Please advise

## 2012-08-29 NOTE — Telephone Encounter (Signed)
Pt is aware and will let him know what RMR recommendation are.

## 2012-09-01 NOTE — Progress Notes (Signed)
Quick Note:  HIDA scan reviewed with Dr. Jena Gauss. 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  No evidence of CBD obstruction or dilation. NO symptoms with CCK. I recommend a TCS/EGD as previously planned. May offer f/u ov to schedule this ______

## 2012-09-18 ENCOUNTER — Encounter: Payer: Self-pay | Admitting: Gastroenterology

## 2012-09-18 ENCOUNTER — Ambulatory Visit (INDEPENDENT_AMBULATORY_CARE_PROVIDER_SITE_OTHER): Payer: Medicare Other | Admitting: Gastroenterology

## 2012-09-18 VITALS — BP 106/73 | HR 81 | Temp 97.6°F | Ht 65.0 in | Wt 177.4 lb

## 2012-09-18 DIAGNOSIS — K219 Gastro-esophageal reflux disease without esophagitis: Secondary | ICD-10-CM

## 2012-09-18 MED ORDER — ESOMEPRAZOLE MAGNESIUM 40 MG PO CPDR: 40.0000 mg | DELAYED_RELEASE_CAPSULE | Freq: Every day | ORAL | Status: DC

## 2012-09-18 NOTE — Progress Notes (Signed)
Cc PCP 

## 2012-09-18 NOTE — Progress Notes (Signed)
Primary Care Physician:  Milana Obey, MD  Primary Gastroenterologist:  Roetta Sessions, MD   Chief Complaint  Patient presents with  . Follow-up    HPI:  Brent Rowser. is a 67 y.o. male here to discuss possibly setting up an upper endoscopy for further evaluation of right upper quadrant abdominal pain. He was last seen in April 2014. He has chronic GERD well controlled on Nexium. Abdominal ultrasound and HIDA scan unremarkable. CBC, LFTs, lipase unremarkable.  No longer has the abdominal pain. States he came off the Nexium because he thought it might be making him sick but when he resumed it after a couple of weeks his abdominal discomfort went away. No heartburn, dysphagia. BM regular. No melena, brbpr. No FH of CRC. Does not want to have EGD/TCS.  Current Outpatient Prescriptions  Medication Sig Dispense Refill  . aspirin 81 MG tablet Take 81 mg by mouth daily.        Marland Kitchen atorvastatin (LIPITOR) 20 MG tablet Take 1 tablet (20 mg total) by mouth at bedtime.  90 tablet  3  . esomeprazole (NEXIUM) 40 MG capsule Take 40 mg by mouth daily as needed (Acid Reflux).       Marland Kitchen lisinopril-hydrochlorothiazide (PRINZIDE,ZESTORETIC) 20-12.5 MG per tablet Take 2 tablets by mouth daily.  60 tablet  11  . metoprolol succinate (TOPROL-XL) 50 MG 24 hr tablet Take 50 mg by mouth daily.      . nitroGLYCERIN (NITROSTAT) 0.4 MG SL tablet Place 0.4 mg under the tongue every 5 (five) minutes as needed for chest pain.       No current facility-administered medications for this visit.    Allergies as of 09/18/2012 - Review Complete 09/18/2012  Allergen Reaction Noted  . Sulfa antibiotics Rash 06/12/2012    Past Medical History  Diagnosis Date  . ASCVD (arteriosclerotic cardiovascular disease)      PTCA of LAD in 03/1998; recath a few weeks later-no restenosis; apical MI in 04/1998; restenosis of the LAD-stent placed; rotational atherectomy of first diagonal  . Hyperlipidemia   . Hypertension   . Past  use of tobacco     40 pack years discontinued in 03/1998  . GERD (gastroesophageal reflux disease)   . Anxiety   . BPH (benign prostatic hyperplasia)   . Chronic prostatitis   . Diverticulosis     Past Surgical History  Procedure Laterality Date  . Polypectomy  1997    Polyps removed on vocal cords   . Coronary stent placement      x 2    Family History  Problem Relation Age of Onset  . Breast cancer Daughter     52's  . CAD      History   Social History  . Marital Status: Married    Spouse Name: N/A    Number of Children: N/A  . Years of Education: N/A   Occupational History  . Professor     [employer]   Social History Main Topics  . Smoking status: Former Smoker -- 1.00 packs/day for 30 years    Types: Pipe    Quit date: 06/21/1998  . Smokeless tobacco: Never Used  . Alcohol Use: No  . Drug Use: No  . Sexually Active: Not on file   Other Topics Concern  . 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 Eyes: Negative for vision changes.  ENT: Negative for hoarseness, difficulty swallowing , nasal congestion. CV: Negative for chest pain, angina, palpitations, dyspnea on exertion, peripheral edema.  Respiratory: Negative for dyspnea at rest, dyspnea on exertion, cough, sputum, wheezing.  GI: See history of present illness. GU:  Negative for dysuria, hematuria, urinary incontinence, urinary frequency, nocturnal urination.  MS: Negative for joint pain, low back pain.  Derm: Negative for rash or itching.  Neuro: Negative for weakness, abnormal sensation, seizure, frequent headaches, memory loss, confusion.  Psych: Negative for anxiety, depression, suicidal ideation, hallucinations.  Endo: Negative for unusual weight change.  Heme: Negative for bruising or  bleeding. Allergy: Negative for rash or hives.    Physical Examination:  BP 106/73  Pulse 81  Temp(Src) 97.6 F (36.4 C) (Oral)  Ht 5\' 5"  (1.651 m)  Wt 177 lb 6.4 oz (80.468 kg)  BMI 29.52 kg/m2   General: Well-nourished, well-developed in no acute distress.  Head: Normocephalic, atraumatic.   Eyes: Conjunctiva pink, no icterus. Mouth: Oropharyngeal mucosa moist and pink , no lesions erythema or exudate. Neck: Supple without thyromegaly, masses, or lymphadenopathy.  Lungs: Clear to auscultation bilaterally.  Heart: Regular rate and rhythm, no murmurs rubs or gallops.  Abdomen: Bowel sounds are normal, nontender, nondistended, no hepatosplenomegaly or masses, no abdominal bruits or    hernia , no rebound or guarding.   Rectal: Not performed Extremities: No lower extremity edema. No clubbing or deformities.  Neuro: Alert and oriented x 4 , grossly normal neurologically.  Skin: Warm and dry, no rash or jaundice.   Psych: Alert and cooperative, normal mood and affect.

## 2012-09-18 NOTE — Patient Instructions (Addendum)
1. Prescription for Nexium sent to pharmacy. 2. Office visit as needed.

## 2012-09-18 NOTE — Assessment & Plan Note (Signed)
58 six-year-old gentleman with recent right upper quadrant discomfort which is now resolved. GERD well controlled. He is not interested in pursuing upper endoscopy even for Barrett's screening. He is also not interested in pursuing colonoscopy and realizes that he may be harboring a polyp that could turn into malignancy at some point. He accepts the risk. Continue Nexium 40 mg daily. Prescription for one year refills provided. Office visit when necessary.

## 2013-03-27 NOTE — Progress Notes (Signed)
REVIEWED.  

## 2013-04-02 ENCOUNTER — Other Ambulatory Visit: Payer: Self-pay | Admitting: Cardiology

## 2013-04-07 ENCOUNTER — Other Ambulatory Visit: Payer: Self-pay | Admitting: Cardiology

## 2013-06-04 ENCOUNTER — Telehealth: Payer: Self-pay | Admitting: Gastroenterology

## 2013-06-04 MED ORDER — METRONIDAZOLE 500 MG PO TABS: 500.0000 mg | ORAL_TABLET | Freq: Three times a day (TID) | ORAL | Status: DC

## 2013-06-04 NOTE — Telephone Encounter (Signed)
Patient presented to office with spouse today. He voiced concern for 48 hour h/o diarrhea. Wife has C.Diff and he has been her caregiver.   RX for Flagyl sent to pharmacy. Call if persistent symptoms.

## 2013-07-27 ENCOUNTER — Ambulatory Visit (INDEPENDENT_AMBULATORY_CARE_PROVIDER_SITE_OTHER): Payer: Medicare Other | Admitting: Cardiovascular Disease

## 2013-07-27 ENCOUNTER — Encounter: Payer: Self-pay | Admitting: Cardiovascular Disease

## 2013-07-27 VITALS — BP 114/80 | HR 63 | Ht 67.0 in | Wt 171.0 lb

## 2013-07-27 DIAGNOSIS — E7800 Pure hypercholesterolemia, unspecified: Secondary | ICD-10-CM

## 2013-07-27 DIAGNOSIS — I2510 Atherosclerotic heart disease of native coronary artery without angina pectoris: Secondary | ICD-10-CM

## 2013-07-27 DIAGNOSIS — I10 Essential (primary) hypertension: Secondary | ICD-10-CM

## 2013-07-27 MED ORDER — ATORVASTATIN CALCIUM 20 MG PO TABS: 20.0000 mg | ORAL_TABLET | Freq: Every day | ORAL | Status: DC

## 2013-07-27 MED ORDER — METOPROLOL SUCCINATE ER 50 MG PO TB24: 50.0000 mg | ORAL_TABLET | Freq: Every day | ORAL | Status: DC

## 2013-07-27 MED ORDER — LISINOPRIL-HYDROCHLOROTHIAZIDE 20-12.5 MG PO TABS: 2.0000 | ORAL_TABLET | Freq: Every day | ORAL | Status: DC

## 2013-07-27 NOTE — Patient Instructions (Addendum)
Your physician wants you to follow-up in: 1 year You will receive a reminder letter in the mail two months in advance. If you don't receive a letter, please call our office to schedule the follow-up appointment.    Your physician recommends that you continue on your current medications as directed. Please refer to the Current Medication list given to you today.     Please get blood work (liver function tests,lipid profile)      Thank you for choosing [facility] !

## 2013-07-27 NOTE — Progress Notes (Signed)
Patient ID: Brent Brock., male   DOB: [birth year], 68 y.o.   MRN: [id number]      SUBJECTIVE: The patient is a 68 year old male with a history of a myocardial infarction several years ago requiring percutaneous coronary intervention. He also has a history of hypertension, hyperlipidemia, and a former history of tobacco abuse. His wife who recently passed away of septic shock, Brent Brock, was my former patient.  [name] is doing well from a symptomatic standpoint, in that he denies chest pain, shortness of breath, palpitations, lightheadedness, orthopnea, paroxysmal nocturnal dyspnea, and leg swelling. He is still clearly grieving the loss of his wife of over 29 years. His grandson is staying with him now.  He recently walked 2.6 miles for a hospice walk with no difficulties whatsoever.  ECG today shows normal sinus rhythm with a nonspecific T wave abnormality in lead III.  Allergies  Allergen Reactions  . Sulfa Antibiotics Rash    Current Outpatient Prescriptions  Medication Sig Dispense Refill  . aspirin 81 MG tablet Take 81 mg by mouth daily.        Marland Kitchen atorvastatin (LIPITOR) 20 MG tablet TAKE ONE TABLET BY MOUTH EVERY NIGHT AT BEDTIME  90 tablet  3  . esomeprazole (NEXIUM) 40 MG capsule Take 1 capsule (40 mg total) by mouth daily before breakfast.  30 capsule  11  . lisinopril-hydrochlorothiazide (PRINZIDE,ZESTORETIC) 20-12.5 MG per tablet Take 2 tablets by mouth daily.  60 tablet  11  . metoprolol succinate (TOPROL-XL) 50 MG 24 hr tablet Take 50 mg by mouth daily.      . metoprolol succinate (TOPROL-XL) 50 MG 24 hr tablet TAKE ONE (1) TABLET BY MOUTH EVERY DAY  90 tablet  3  . metroNIDAZOLE (FLAGYL) 500 MG tablet Take 1 tablet (500 mg total) by mouth 3 (three) times daily.  30 tablet  0  . nitroGLYCERIN (NITROSTAT) 0.4 MG SL tablet Place 0.4 mg under the tongue every 5 (five) minutes as needed for chest pain.       No current facility-administered medications for this visit.     Past Medical History  Diagnosis Date  . ASCVD (arteriosclerotic cardiovascular disease)      PTCA of LAD in 03/1998; recath a few weeks later-no restenosis; apical MI in 04/1998; restenosis of the LAD-stent placed; rotational atherectomy of first diagonal  . Hyperlipidemia   . Hypertension   . Past use of tobacco     40 pack years discontinued in 03/1998  . GERD (gastroesophageal reflux disease)   . Anxiety   . BPH (benign prostatic hyperplasia)   . Chronic prostatitis   . Diverticulosis     Past Surgical History  Procedure Laterality Date  . Polypectomy  1997    Polyps removed on vocal cords   . Coronary stent placement      x 2    History   Social History  . Marital Status: Married    Spouse Name: N/A    Number of Children: N/A  . Years of Education: N/A   Occupational History  . Professor     [employer]   Social History Main Topics  . Smoking status: Former Smoker -- 1.00 packs/day for 30 years    Types: Pipe    Quit date: 06/21/1998  . Smokeless tobacco: Never Used  . Alcohol Use: No  . Drug Use: No  . Sexual Activity: Not on file   Other Topics Concern  . Not on file   Social  History Narrative   Lives w/ Wife   Lost only daughter from metastatic breast cancer    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[employer]    BP 114/80 Pulse 63    PHYSICAL EXAM General: NAD Neck: No JVD, no thyromegaly. Lungs: Clear to auscultation bilaterally with normal respiratory effort. CV: Nondisplaced PMI.  Regular rate and rhythm, normal S1/S2, no S3/S4, no murmur. No pretibial or periankle edema.  No carotid bruit.  Normal pedal pulses.  Abdomen: Soft, nontender, no hepatosplenomegaly, no distention.  Neurologic: Alert and oriented x 3.  Psych: Normal affect. Extremities: No clubbing or cyanosis.   ECG: reviewed and available in electronic records.      ASSESSMENT AND PLAN: 1. CAD: Symptomatically stable. Continue current therapy with aspirin, statin, beta  blocker and ACE inhibitor. 2. Hypertension: Well controlled on current medical therapy which includes Prinzide and metoprolol. 3. Hyperlipidemia: Currently taking Lipitor 20 mg daily. I will check a lipid panel and LFTs.  Dispo: f/u 1 year.  Kate Sable, M.D., F.A.C.C.

## 2013-07-28 LAB — HEPATIC FUNCTION PANEL
ALT: 22 U/L (ref 0–53)
AST: 22 U/L (ref 0–37)
Albumin: 4.2 g/dL (ref 3.5–5.2)
Alkaline Phosphatase: 88 U/L (ref 39–117)
BILIRUBIN DIRECT: 0.2 mg/dL (ref 0.0–0.3)
BILIRUBIN INDIRECT: 0.7 mg/dL (ref 0.2–1.2)
TOTAL PROTEIN: 6.7 g/dL (ref 6.0–8.3)
Total Bilirubin: 0.9 mg/dL (ref 0.2–1.2)

## 2013-07-28 LAB — LIPID PANEL
CHOLESTEROL: 134 mg/dL (ref 0–200)
HDL: 41 mg/dL (ref >39)
LDL CALC: 77 mg/dL (ref 0–99)
TRIGLYCERIDES: 80 mg/dL (ref <150)
Total CHOL/HDL Ratio: 3.3 Ratio
VLDL: 16 mg/dL (ref 0–40)

## 2013-10-23 ENCOUNTER — Ambulatory Visit (HOSPITAL_COMMUNITY): Payer: Medicare Other | Admitting: Psychology

## 2013-11-10 ENCOUNTER — Ambulatory Visit (INDEPENDENT_AMBULATORY_CARE_PROVIDER_SITE_OTHER): Payer: Medicare Other | Admitting: Psychology

## 2013-11-10 DIAGNOSIS — F4323 Adjustment disorder with mixed anxiety and depressed mood: Secondary | ICD-10-CM

## 2013-11-13 ENCOUNTER — Encounter (HOSPITAL_COMMUNITY): Payer: Self-pay | Admitting: Psychology

## 2013-11-13 NOTE — Progress Notes (Signed)
   PROGRESS NOTE  The patient for the first time related to his own struggles in issues. I've seen his wife for many years and we've gone through a number of issues most notably cancer that she had been receiving treatment for. The patient's wife recently passed away after complications related to a subsequent gastrointestinal disorder and pulmonary issues. The patient was quite distressed by her death and it hit him harder than he thought it would. She is a close to death symptoms prior. However, the fact that she did not die specifically at issues related to her cancer but died and infection and gastrointestinal difficulties following a colonoscopy. He is not sure what exactly happened but she became gravely ill and ended up dying. The patient is had trouble coping with this but while he is experiences loss and also coincided with the grandchildren becoming involved. Most notably he had the most involvement from his grandson was there for the last couple of days of his wife's life continued to be close to spend time together after that. He also saw his 2 granddaughters and continue to communicate with him for sometime after that. However, the patient is seen as grandchildren are heard from them for the past several weeks.    Edgardo Roys, PsyD 11/13/2013

## 2013-12-04 ENCOUNTER — Emergency Department (HOSPITAL_COMMUNITY): Payer: Medicare Other

## 2013-12-04 ENCOUNTER — Encounter (HOSPITAL_COMMUNITY): Payer: Self-pay | Admitting: Emergency Medicine

## 2013-12-04 ENCOUNTER — Emergency Department (HOSPITAL_COMMUNITY)
Admission: EM | Admit: 2013-12-04 | Discharge: 2013-12-04 | Disposition: A | Discharge status: Home / Self Care | Payer: Medicare Other | Attending: Emergency Medicine | Admitting: Emergency Medicine

## 2013-12-04 DIAGNOSIS — I10 Essential (primary) hypertension: Secondary | ICD-10-CM | POA: Insufficient documentation

## 2013-12-04 DIAGNOSIS — R071 Chest pain on breathing: Secondary | ICD-10-CM | POA: Insufficient documentation

## 2013-12-04 DIAGNOSIS — R0602 Shortness of breath: Secondary | ICD-10-CM | POA: Diagnosis present

## 2013-12-04 DIAGNOSIS — Z79899 Other long term (current) drug therapy: Secondary | ICD-10-CM | POA: Diagnosis not present

## 2013-12-04 DIAGNOSIS — E785 Hyperlipidemia, unspecified: Secondary | ICD-10-CM | POA: Insufficient documentation

## 2013-12-04 DIAGNOSIS — K219 Gastro-esophageal reflux disease without esophagitis: Secondary | ICD-10-CM | POA: Diagnosis not present

## 2013-12-04 DIAGNOSIS — R079 Chest pain, unspecified: Secondary | ICD-10-CM

## 2013-12-04 DIAGNOSIS — Z87891 Personal history of nicotine dependence: Secondary | ICD-10-CM | POA: Diagnosis not present

## 2013-12-04 DIAGNOSIS — Z7982 Long term (current) use of aspirin: Secondary | ICD-10-CM | POA: Insufficient documentation

## 2013-12-04 DIAGNOSIS — Z9861 Coronary angioplasty status: Secondary | ICD-10-CM | POA: Diagnosis not present

## 2013-12-04 DIAGNOSIS — Z8659 Personal history of other mental and behavioral disorders: Secondary | ICD-10-CM | POA: Diagnosis not present

## 2013-12-04 DIAGNOSIS — Z87448 Personal history of other diseases of urinary system: Secondary | ICD-10-CM | POA: Insufficient documentation

## 2013-12-04 DIAGNOSIS — Z880 Allergy status to penicillin: Secondary | ICD-10-CM | POA: Insufficient documentation

## 2013-12-04 DIAGNOSIS — R0789 Other chest pain: Secondary | ICD-10-CM

## 2013-12-04 LAB — CBC
HCT: 41.5 % (ref 39.0–52.0)
Hemoglobin: 14.8 g/dL (ref 13.0–17.0)
MCH: 30.2 pg (ref 26.0–34.0)
MCHC: 35.7 g/dL (ref 30.0–36.0)
MCV: 84.7 fL (ref 78.0–100.0)
Platelets: 148 10*3/uL — ABNORMAL LOW (ref 150–400)
RBC: 4.9 MIL/uL (ref 4.22–5.81)
RDW: 14 % (ref 11.5–15.5)
WBC: 7.8 10*3/uL (ref 4.0–10.5)

## 2013-12-04 LAB — TROPONIN I

## 2013-12-04 LAB — BASIC METABOLIC PANEL
ANION GAP: 16 — AB (ref 5–15)
BUN: 16 mg/dL (ref 6–23)
CO2: 23 meq/L (ref 19–32)
Calcium: 9.3 mg/dL (ref 8.4–10.5)
Chloride: 101 mEq/L (ref 96–112)
Creatinine, Ser: 1.09 mg/dL (ref 0.50–1.35)
GFR calc Af Amer: 79 mL/min — ABNORMAL LOW (ref >90)
GFR calc non Af Amer: 68 mL/min — ABNORMAL LOW (ref >90)
Glucose, Bld: 113 mg/dL — ABNORMAL HIGH (ref 70–99)
POTASSIUM: 3.4 meq/L — AB (ref 3.7–5.3)
SODIUM: 140 meq/L (ref 137–147)

## 2013-12-04 MED ORDER — SODIUM CHLORIDE 0.9 % IV SOLN
INTRAVENOUS | Status: DC
Start: 2013-12-04 — End: 2013-12-04
  Administered 2013-12-04: 14:00:00 via INTRAVENOUS

## 2013-12-04 MED ORDER — TRAMADOL HCL 50 MG PO TABS: 50.0000 mg | ORAL_TABLET | Freq: Four times a day (QID) | ORAL | Status: DC | PRN

## 2013-12-04 MED ORDER — IOHEXOL 350 MG/ML SOLN
100.0000 mL | Freq: Once | INTRAVENOUS | Status: AC | PRN
  Administered 2013-12-04: 100 mL via INTRAVENOUS

## 2013-12-04 NOTE — Discharge Instructions (Signed)
Workup for the chest pain without any significant findings. CT scan negative for any blood clots or other abnormalities in the chest. Suspect pain is chest wall in nature. Take medication as directed. If not improved over the next few days followup with your primary care Dr. Return for any new or worse symptoms.

## 2013-12-04 NOTE — ED Notes (Signed)
Pt co SOB and pain in rt side of chest with deep inspiration x3 days, pain replicated with palpation.

## 2013-12-04 NOTE — ED Provider Notes (Signed)
CSN: [id number]     Arrival date & time 12/04/13  1108 History   First MD Initiated Contact with Patient 12/04/13 1243     Chief Complaint  Patient presents with  . Shortness of Breath     (Consider location/radiation/quality/duration/timing/severity/associated sxs/prior Treatment) Patient is a 68 y.o. male presenting with shortness of breath. The history is provided by the patient.  Shortness of Breath Associated symptoms: chest pain   Associated symptoms: no abdominal pain, no fever, no headaches, no rash and no vomiting    patient with history of right-sided chest pain intermittent x3 days. However present more than it's not. Made worse by bending over or taking a deep breath and coughing. No leg swelling no recent travel. No DVT risk factors. Pain now when present is very sharp 8/10. Nonradiating. Patient states it is reproducible by pressing on his chest.  Past Medical History  Diagnosis Date  . ASCVD (arteriosclerotic cardiovascular disease)      PTCA of LAD in 03/1998; recath a few weeks later-no restenosis; apical MI in 04/1998; restenosis of the LAD-stent placed; rotational atherectomy of first diagonal  . Hyperlipidemia   . Hypertension   . Past use of tobacco     40 pack years discontinued in 03/1998  . GERD (gastroesophageal reflux disease)   . Anxiety   . BPH (benign prostatic hyperplasia)   . Chronic prostatitis   . Diverticulosis    Past Surgical History  Procedure Laterality Date  . Polypectomy  1997    Polyps removed on vocal cords   . Coronary stent placement      x 2   Family History  Problem Relation Age of Onset  . Breast cancer Daughter     84's  . CAD     History  Substance Use Topics  . Smoking status: Former Smoker -- 1.00 packs/day for 30 years    Types: Pipe    Quit date: 06/21/1998  . Smokeless tobacco: Never Used  . Alcohol Use: No    Review of Systems  Constitutional: Negative for fever.  HENT: Negative for congestion.   Eyes: Negative  for visual disturbance.  Respiratory: Positive for shortness of breath.   Cardiovascular: Positive for chest pain. Negative for leg swelling.  Gastrointestinal: Negative for nausea, vomiting and abdominal pain.  Genitourinary: Negative for dysuria.  Musculoskeletal: Negative for back pain.  Skin: Negative for rash.  Neurological: Negative for headaches.  Hematological: Does not bruise/bleed easily.  Psychiatric/Behavioral: Negative for confusion.      Allergies  Penicillins and Sulfa antibiotics  Home Medications   Prior to Admission medications   Medication Sig Start Date End Date Taking? Authorizing Provider  aspirin EC 81 MG tablet Take 81 mg by mouth daily.   Yes Historical Provider, MD  atorvastatin (LIPITOR) 20 MG tablet Take 20 mg by mouth daily.   Yes Historical Provider, MD  esomeprazole (NEXIUM) 40 MG capsule Take 1 capsule (40 mg total) by mouth daily before breakfast. 09/18/12  Yes Mahala Menghini, PA-C  lisinopril-hydrochlorothiazide (PRINZIDE,ZESTORETIC) 20-12.5 MG per tablet Take 1 tablet by mouth daily.   Yes Historical Provider, MD  metoprolol succinate (TOPROL-XL) 50 MG 24 hr tablet Take 1 tablet (50 mg total) by mouth daily. 07/27/13  Yes Herminio Commons, MD  tamsulosin (FLOMAX) 0.4 MG CAPS capsule Take 0.4 mg by mouth daily.   Yes Historical Provider, MD  nitroGLYCERIN (NITROSTAT) 0.4 MG SL tablet Place 0.4 mg under the tongue every 5 (five) minutes as needed  for chest pain.    Historical Provider, MD   BP 138/92  Pulse 97  Temp(Src) 98 F (36.7 C) (Oral)  Resp 16  Ht 5\' 7"  (1.702 m)  Wt 174 lb (78.926 kg)  BMI 27.25 kg/m2  SpO2 93% Physical Exam  Nursing note and vitals reviewed. Constitutional: He is oriented to person, place, and time. He appears well-developed and well-nourished. No distress.  HENT:  Head: Normocephalic and atraumatic.  Mouth/Throat: Oropharynx is clear and moist. No oropharyngeal exudate.  Eyes: Conjunctivae and EOM are normal.  Pupils are equal, round, and reactive to light.  Neck: Normal range of motion. Neck supple.  Cardiovascular: Normal rate, regular rhythm and normal heart sounds.   No murmur heard. Pulmonary/Chest: Effort normal and breath sounds normal. No respiratory distress. He exhibits tenderness.  Tenderness to palpation on the right anterior chest around the 6 rib area.  Abdominal: Soft. Bowel sounds are normal. There is no tenderness.  Musculoskeletal: Normal range of motion. He exhibits no edema.  Neurological: He is alert and oriented to person, place, and time. No cranial nerve deficit. He exhibits normal muscle tone. Coordination normal.  Skin: Skin is warm. No rash noted.    ED Course  Procedures (including critical care time) Labs Review Labs Reviewed  CBC - Abnormal; Notable for the following:    Platelets 148 (*)    All other components within normal limits  BASIC METABOLIC PANEL - Abnormal; Notable for the following:    Potassium 3.4 (*)    Glucose, Bld 113 (*)    GFR calc non Af Amer 68 (*)    GFR calc Af Amer 79 (*)    Anion gap 16 (*)    All other components within normal limits  TROPONIN I   Results for orders placed during the hospital encounter of 12/04/13  CBC      Result Value Ref Range   WBC 7.8  4.0 - 10.5 K/uL   RBC 4.90  4.22 - 5.81 MIL/uL   Hemoglobin 14.8  13.0 - 17.0 g/dL   HCT 41.5  39.0 - 52.0 %   MCV 84.7  78.0 - 100.0 fL   MCH 30.2  26.0 - 34.0 pg   MCHC 35.7  30.0 - 36.0 g/dL   RDW 14.0  11.5 - 15.5 %   Platelets 148 (*) 150 - 400 K/uL  BASIC METABOLIC PANEL      Result Value Ref Range   Sodium 140  137 - 147 mEq/L   Potassium 3.4 (*) 3.7 - 5.3 mEq/L   Chloride 101  96 - 112 mEq/L   CO2 23  19 - 32 mEq/L   Glucose, Bld 113 (*) 70 - 99 mg/dL   BUN 16  6 - 23 mg/dL   Creatinine, Ser 1.09  0.50 - 1.35 mg/dL   Calcium 9.3  8.4 - 10.5 mg/dL   GFR calc non Af Amer 68 (*) >90 mL/min   GFR calc Af Amer 79 (*) >90 mL/min   Anion gap 16 (*) 5 - 15   TROPONIN I      Result Value Ref Range   Troponin I <0.30  <0.30 ng/mL     Imaging Review Dg Chest Port 1 View  12/04/2013   CLINICAL DATA:  Shortness breath  EXAM: PORTABLE CHEST - 1 VIEW  COMPARISON:  05/26/2012  FINDINGS: The heart size and mediastinal contours are within normal limits. Both lungs are clear. The visualized skeletal structures are unremarkable.  IMPRESSION:  No active disease.   Electronically Signed   By: Kathreen Devoid   On: 12/04/2013 12:34   Results for orders placed during the hospital encounter of 12/04/13  CBC      Result Value Ref Range   WBC 7.8  4.0 - 10.5 K/uL   RBC 4.90  4.22 - 5.81 MIL/uL   Hemoglobin 14.8  13.0 - 17.0 g/dL   HCT 41.5  39.0 - 52.0 %   MCV 84.7  78.0 - 100.0 fL   MCH 30.2  26.0 - 34.0 pg   MCHC 35.7  30.0 - 36.0 g/dL   RDW 14.0  11.5 - 15.5 %   Platelets 148 (*) 150 - 400 K/uL  BASIC METABOLIC PANEL      Result Value Ref Range   Sodium 140  137 - 147 mEq/L   Potassium 3.4 (*) 3.7 - 5.3 mEq/L   Chloride 101  96 - 112 mEq/L   CO2 23  19 - 32 mEq/L   Glucose, Bld 113 (*) 70 - 99 mg/dL   BUN 16  6 - 23 mg/dL   Creatinine, Ser 1.09  0.50 - 1.35 mg/dL   Calcium 9.3  8.4 - 10.5 mg/dL   GFR calc non Af Amer 68 (*) >90 mL/min   GFR calc Af Amer 79 (*) >90 mL/min   Anion gap 16 (*) 5 - 15  TROPONIN I      Result Value Ref Range   Troponin I <0.30  <0.30 ng/mL   Ct Angio Chest Pe W/cm &/or Wo Cm  12/04/2013   CLINICAL DATA:  Chest pain and shortness of breath  EXAM: CT ANGIOGRAPHY CHEST WITH CONTRAST  TECHNIQUE: Multidetector CT imaging of the chest was performed using the standard protocol during bolus administration of intravenous contrast. Multiplanar CT image reconstructions and MIPs were obtained to evaluate the vascular anatomy.  CONTRAST:  167mL OMNIPAQUE IOHEXOL 350 MG/ML SOLN  COMPARISON:  Chest x-ray from earlier in the same day.  FINDINGS: Lungs are well aerated bilaterally without focal infiltrate or sizable effusion.  The  thoracic aorta shows dissection or aneurysmal dilatation. Pulmonary artery demonstrates a normal branching pattern without evidence of pulmonary emboli. No significant hilar or mediastinal adenopathy is noted. Heavy coronary calcifications are seen. The visualized upper abdomen is within normal limits. The osseous structures show no acute abnormality.  Review of the MIP images confirms the above findings.  IMPRESSION: No evidence of pulmonary emboli.  No acute abnormality seen.   Electronically Signed   By: Inez Catalina M.D.   On: 12/04/2013 14:38   Dg Chest Port 1 View  12/04/2013   CLINICAL DATA:  Shortness breath  EXAM: PORTABLE CHEST - 1 VIEW  COMPARISON:  05/26/2012  FINDINGS: The heart size and mediastinal contours are within normal limits. Both lungs are clear. The visualized skeletal structures are unremarkable.  IMPRESSION: No active disease.   Electronically Signed   By: Kathreen Devoid   On: 12/04/2013 12:34      EKG Interpretation   Date/Time:  Friday December 04 2013 11:23:10 EDT Ventricular Rate:  104 PR Interval:  190 QRS Duration: 93 QT Interval:  334 QTC Calculation: 439 R Axis:   24 Text Interpretation:  Sinus tachycardia Confirmed by Deny Chevez  MD, Zareen Jamison  ([id number]) on 12/04/2013 11:25:58 AM      MDM   Final diagnoses:  Chest pain, unspecified chest pain type    Patient with 3 days history of right-sided pruritic type chest pain  no leg swelling no recent travel no history of DVTs in the past. Patient clinically is concerning for possible pulmonary embolus is persistent resting tachycardia. CT angios be done to rule out PE. No evidence of an acute cardiac event based on EKG and also troponin was negative. No significant lab abnormalities other than some mild hypokalemia with potassium of 3.4.  CT scan negative for any acute abnormalities no evidence of a pulmonary embolus. No acute abnormality on the CT scan. Pain is most likely chest wall in nature. We'll treat with  tramadol and have him followup with his regular Dr.  Fredia Sorrow, MD 12/04/13 1455

## 2014-01-27 ENCOUNTER — Ambulatory Visit (INDEPENDENT_AMBULATORY_CARE_PROVIDER_SITE_OTHER): Payer: Medicare Other | Admitting: Psychology

## 2014-01-27 DIAGNOSIS — F4323 Adjustment disorder with mixed anxiety and depressed mood: Secondary | ICD-10-CM

## 2014-03-03 ENCOUNTER — Encounter (HOSPITAL_COMMUNITY): Payer: Self-pay | Admitting: Psychology

## 2014-03-03 NOTE — Progress Notes (Signed)
    PROGRESS NOTE  The patient reports that he is coping better with the issues of the loss of his wife after she died from cancer. The patient reports that he is now worried a great deal about his grandson as his grandson has lost his scholarship at school and apparently been engaging in a lot of disruptive things. The father actually called the patient which was extremely unusual and talked about the issues and requested any help that can be provided.   Edgardo Roys, PsyD 03/03/2014

## 2014-04-01 ENCOUNTER — Other Ambulatory Visit: Payer: Self-pay | Admitting: Cardiovascular Disease

## 2014-04-01 ENCOUNTER — Telehealth: Payer: Self-pay | Admitting: Cardiovascular Disease

## 2014-04-06 ENCOUNTER — Telehealth: Payer: Self-pay | Admitting: *Deleted

## 2014-04-06 MED ORDER — LISINOPRIL-HYDROCHLOROTHIAZIDE 20-12.5 MG PO TABS: ORAL_TABLET | ORAL | Status: DC

## 2014-04-06 NOTE — Telephone Encounter (Signed)
Refill

## 2014-04-08 ENCOUNTER — Other Ambulatory Visit: Payer: Self-pay | Admitting: Cardiovascular Disease

## 2014-06-03 ENCOUNTER — Other Ambulatory Visit: Payer: Self-pay | Admitting: Cardiovascular Disease

## 2014-07-30 ENCOUNTER — Ambulatory Visit (INDEPENDENT_AMBULATORY_CARE_PROVIDER_SITE_OTHER): Payer: Medicare Other | Admitting: Cardiovascular Disease

## 2014-07-30 ENCOUNTER — Encounter: Payer: Self-pay | Admitting: Cardiovascular Disease

## 2014-07-30 VITALS — BP 110/76 | HR 70 | Ht 67.0 in

## 2014-07-30 DIAGNOSIS — E782 Mixed hyperlipidemia: Secondary | ICD-10-CM | POA: Diagnosis not present

## 2014-07-30 DIAGNOSIS — I2510 Atherosclerotic heart disease of native coronary artery without angina pectoris: Secondary | ICD-10-CM

## 2014-07-30 DIAGNOSIS — I10 Essential (primary) hypertension: Secondary | ICD-10-CM | POA: Diagnosis not present

## 2014-07-30 MED ORDER — METOPROLOL SUCCINATE ER 50 MG PO TB24: 50.0000 mg | ORAL_TABLET | Freq: Every day | ORAL | Status: DC

## 2014-07-30 MED ORDER — ATORVASTATIN CALCIUM 20 MG PO TABS: 20.0000 mg | ORAL_TABLET | Freq: Every day | ORAL | Status: DC

## 2014-07-30 MED ORDER — LISINOPRIL-HYDROCHLOROTHIAZIDE 20-12.5 MG PO TABS: 1.0000 | ORAL_TABLET | Freq: Two times a day (BID) | ORAL | Status: DC

## 2014-07-30 MED ORDER — NITROGLYCERIN 0.4 MG SL SUBL: 0.4000 mg | SUBLINGUAL_TABLET | SUBLINGUAL | Status: DC | PRN

## 2014-07-30 MED ORDER — AMLODIPINE BESYLATE 5 MG PO TABS: 5.0000 mg | ORAL_TABLET | Freq: Every day | ORAL | Status: DC

## 2014-07-30 NOTE — Progress Notes (Signed)
Patient ID: Brent Brock., male   DOB: [birth year], 69 y.o.   MRN: [id number]      SUBJECTIVE: The patient returns for routine follow up. He has a history of a myocardial infarction several years ago requiring percutaneous coronary intervention. He also has a history of hypertension, hyperlipidemia, and a former history of tobacco abuse. His deceased wife, Barnetta Chapel, used to be my patient.  He has been doing well and denies chest pain, palpitations, shortness of breath, lightheadedness, and leg swelling with normal routine activities. 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 He did participate in a fundraising walk approximately two weeks ago and experienced  shortness of breath while climbing hills but this quickly resolved on level ground. He has been checking his blood pressure and diastolic readings have been consistently in the 90-97 range. Systolic readings have been primarily normal.  His grandson still lives with him which helps a lot.   Review of Systems: As per "subjective", otherwise negative.  Allergies  Allergen Reactions  . Penicillins Other (See Comments)    Thrush  . Sulfa Antibiotics Rash    Current Outpatient Prescriptions  Medication Sig Dispense Refill  . aspirin EC 81 MG tablet Take 81 mg by mouth daily.    Marland Kitchen atorvastatin (LIPITOR) 20 MG tablet Take 20 mg by mouth daily.    Marland Kitchen esomeprazole (NEXIUM) 40 MG capsule Take 1 capsule (40 mg total) by mouth daily before breakfast. 30 capsule 11  . lisinopril-hydrochlorothiazide (PRINZIDE,ZESTORETIC) 20-12.5 MG per tablet Take 1 tablet by mouth daily.    . metoprolol succinate (TOPROL-XL) 50 MG 24 hr tablet Take 1 tablet (50 mg total) by mouth daily. 90 tablet 3  . metoprolol succinate (TOPROL-XL) 50 MG 24 hr tablet TAKE ONE (1) TABLET BY MOUTH EVERY DAY 90 tablet 0  . nitroGLYCERIN (NITROSTAT) 0.4 MG SL tablet Place 0.4 mg under the tongue every 5 (five) minutes as needed for chest  pain.    . tamsulosin (FLOMAX) 0.4 MG CAPS capsule Take 0.4 mg by mouth daily.    . traMADol (ULTRAM) 50 MG tablet Take 1 tablet (50 mg total) by mouth every 6 (six) hours as needed. 20 tablet 0   No current facility-administered medications for this visit.    Past Medical History  Diagnosis Date  . ASCVD (arteriosclerotic cardiovascular disease)      PTCA of LAD in 03/1998; recath a few weeks later-no restenosis; apical MI in 04/1998; restenosis of the LAD-stent placed; rotational atherectomy of first diagonal  . Hyperlipidemia   . Hypertension   . Past use of tobacco     40 pack years discontinued in 03/1998  . GERD (gastroesophageal reflux disease)   . Anxiety   . BPH (benign prostatic hyperplasia)   . Chronic prostatitis   . Diverticulosis     Past Surgical History  Procedure Laterality Date  . Polypectomy  1997    Polyps removed on vocal cords   . Coronary stent placement      x 2    History   Social History  . Marital Status: Married    Spouse Name: N/A  . Number of Children: N/A  . Years of Education: N/A   Occupational History  . Professor     [employer]   Social History Main Topics  . Smoking status: Former Smoker -- 1.00 packs/day for 30 years    Types: Pipe    Start date: 03/26/1960    Quit  date: 06/21/1998  . Smokeless tobacco: Never Used  . Alcohol Use: No  . Drug Use: No  . Sexual Activity: Not on file   Other Topics Concern  . 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 Neck: No JVD, no thyromegaly. Lungs: Clear to auscultation bilaterally with normal respiratory effort. CV: Nondisplaced PMI.  Regular rate and rhythm, normal S1/S2, no S3/S4, no murmur. No pretibial or periankle edema.   No carotid bruit.  Normal pedal pulses.  Abdomen: Soft, nontender, no hepatosplenomegaly, no distention.  Neurologic: Alert and oriented x 3.  Psych: Normal affect. Skin: Normal. Musculoskeletal: Normal range of motion, no gross deformities. Extremities: No clubbing or cyanosis.   ECG: Most recent ECG reviewed.      ASSESSMENT AND PLAN: 1. CAD: Symptomatically stable. Continue current therapy with aspirin, statin, beta blocker and ACE inhibitor. If he has a progression of symptoms, I would consider stress testing but not at this time.  2. Essential hypertension: Elevated at home on current medical therapy which includes Prinzide 20-25 mg and metoprolol. Will add amlodipine 5 mg.  3. Hyperlipidemia: Currently taking Lipitor 20 mg daily. I will obtain a copy of lipids from PCP.  Dispo: f/u 1 year.  Kate Sable, M.D., F.A.C.C.

## 2014-07-30 NOTE — Patient Instructions (Signed)
Your physician wants you to follow-up in: 1 year with Dr. Bronson Ing. You will receive a reminder letter in the mail two months in advance. If you don't receive a letter, please call our office to schedule the follow-up appointment.  Start Norvasc 5 mg Daily  Thank you for choosing [facility]!

## 2014-07-30 NOTE — Addendum Note (Signed)
Addended by: Levonne Hubert on: 07/30/2014 10:03 AM   Modules accepted: Orders, Level of Service

## 2014-12-13 ENCOUNTER — Ambulatory Visit (HOSPITAL_COMMUNITY)
Admission: RE | Admit: 2014-12-13 | Discharge: 2014-12-13 | Disposition: A | Discharge status: Home / Self Care | Payer: Medicare Other | Source: Ambulatory Visit | Attending: Family Medicine | Admitting: Family Medicine

## 2014-12-13 ENCOUNTER — Other Ambulatory Visit (HOSPITAL_COMMUNITY): Payer: Self-pay | Admitting: Family Medicine

## 2014-12-13 DIAGNOSIS — M542 Cervicalgia: Secondary | ICD-10-CM

## 2014-12-13 DIAGNOSIS — M1388 Other specified arthritis, other site: Secondary | ICD-10-CM | POA: Insufficient documentation

## 2015-07-29 ENCOUNTER — Encounter: Payer: Self-pay | Admitting: Cardiovascular Disease

## 2015-07-29 ENCOUNTER — Ambulatory Visit (INDEPENDENT_AMBULATORY_CARE_PROVIDER_SITE_OTHER): Payer: Medicare Other | Admitting: Cardiovascular Disease

## 2015-07-29 VITALS — BP 106/66 | HR 76 | Ht 67.0 in | Wt 179.0 lb

## 2015-07-29 DIAGNOSIS — I10 Essential (primary) hypertension: Secondary | ICD-10-CM | POA: Diagnosis not present

## 2015-07-29 DIAGNOSIS — I2510 Atherosclerotic heart disease of native coronary artery without angina pectoris: Secondary | ICD-10-CM

## 2015-07-29 DIAGNOSIS — E782 Mixed hyperlipidemia: Secondary | ICD-10-CM

## 2015-07-29 MED ORDER — LISINOPRIL-HYDROCHLOROTHIAZIDE 20-12.5 MG PO TABS: 1.0000 | ORAL_TABLET | Freq: Two times a day (BID) | ORAL | Status: DC

## 2015-07-29 MED ORDER — AMLODIPINE BESYLATE 5 MG PO TABS: 5.0000 mg | ORAL_TABLET | Freq: Every day | ORAL | Status: DC

## 2015-07-29 MED ORDER — NITROGLYCERIN 0.4 MG SL SUBL: 0.4000 mg | SUBLINGUAL_TABLET | SUBLINGUAL | Status: DC | PRN

## 2015-07-29 MED ORDER — METOPROLOL SUCCINATE ER 50 MG PO TB24: 50.0000 mg | ORAL_TABLET | Freq: Every day | ORAL | Status: DC

## 2015-07-29 MED ORDER — ATORVASTATIN CALCIUM 20 MG PO TABS: 20.0000 mg | ORAL_TABLET | Freq: Every day | ORAL | Status: DC

## 2015-07-29 NOTE — Patient Instructions (Signed)
Your physician wants you to follow-up in: Brent Brock will receive a reminder letter in the mail two months in advance. If you don't receive a letter, please call our office to schedule the follow-up appointment.   Your physician recommends that you continue on your current medications as directed. Please refer to the Current Medication list given to you today.     Thank you for choosing [facility] !

## 2015-07-29 NOTE — Progress Notes (Signed)
Patient ID: Brent Brock., male   DOB: 24-Feb-1946, 70 y.o.   MRN: [id number]      SUBJECTIVE: The patient returns for routine follow up. He has a history of a myocardial infarction several years ago requiring percutaneous coronary intervention. He also has a history of hypertension, hyperlipidemia, and a former history of tobacco abuse.  He has been doing well and denies chest pain, palpitations, shortness of breath, lightheadedness, and leg swelling with normal routine activities.   He has been hampered by bilateral knee pain but has not seen an orthopedic doctor yet. We talked about this at length.  Soc: His deceased wife, Barnetta Chapel, used to be my patient. 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 Has 3 grandchildren, a grandson who lives with him and attends [employer], a granddaughter attends [employer].   Review of Systems: As per "subjective", otherwise negative.  Allergies  Allergen Reactions  . Penicillins Other (See Comments)    Thrush  . Sulfa Antibiotics Rash    Current Outpatient Prescriptions  Medication Sig Dispense Refill  . amLODipine (NORVASC) 5 MG tablet Take 1 tablet (5 mg total) by mouth daily. 90 tablet 3  . aspirin EC 81 MG tablet Take 81 mg by mouth daily.    Marland Kitchen atorvastatin (LIPITOR) 20 MG tablet Take 1 tablet (20 mg total) by mouth daily. 90 tablet 3  . esomeprazole (NEXIUM) 40 MG capsule Take 1 capsule (40 mg total) by mouth daily before breakfast. (Patient taking differently: Take 40 mg by mouth daily before breakfast. Taking 1 every other day) 30 capsule 11  . lisinopril-hydrochlorothiazide (PRINZIDE,ZESTORETIC) 20-12.5 MG per tablet Take 1 tablet by mouth 2 (two) times daily. 180 tablet 3  . metoprolol succinate (TOPROL-XL) 50 MG 24 hr tablet Take 1 tablet (50 mg total) by mouth daily. 90 tablet 3  . tamsulosin (FLOMAX) 0.4 MG CAPS capsule Take 0.4 mg by mouth daily.    . nitroGLYCERIN (NITROSTAT) 0.4 MG SL tablet Place  1 tablet (0.4 mg total) under the tongue every 5 (five) minutes as needed for chest pain. (Patient not taking: Reported on 07/29/2015) 25 tablet 3   No current facility-administered medications for this visit.    Past Medical History  Diagnosis Date  . ASCVD (arteriosclerotic cardiovascular disease)      PTCA of LAD in 03/1998; recath a few weeks later-no restenosis; apical MI in 04/1998; restenosis of the LAD-stent placed; rotational atherectomy of first diagonal  . Hyperlipidemia   . Hypertension   . Past use of tobacco     40 pack years discontinued in 03/1998  . GERD (gastroesophageal reflux disease)   . Anxiety   . BPH (benign prostatic hyperplasia)   . Chronic prostatitis   . Diverticulosis     Past Surgical History  Procedure Laterality Date  . Polypectomy  1997    Polyps removed on vocal cords   . Coronary stent placement      x 2    Social History   Social History  . Marital Status: Widowed    Spouse Name: N/A  . Number of Children: N/A  . Years of Education: N/A   Occupational History  . Professor     [employer]   Social History Main Topics  . Smoking status: Former Smoker -- 1.00 packs/day for 30 years    Types: Pipe    Start date: 03/26/1960    Quit date: 06/21/1998  . Smokeless tobacco: Never Used  .  Alcohol Use: No  . Drug Use: No  . Sexual Activity: Not on file   Other Topics Concern  . 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 Neck: No JVD, no thyromegaly. Lungs: Clear to auscultation bilaterally with normal respiratory effort. CV: Nondisplaced PMI.  Regular rate and rhythm, normal S1/S2, no S3/S4, no murmur. No pretibial or periankle edema.  No carotid  bruit.   Abdomen: Soft, nontender, no distention.  Neurologic: Alert and oriented.  Psych: Normal affect. Skin: Normal. Musculoskeletal: No gross deformities.    ECG: Most recent ECG reviewed.      ASSESSMENT AND PLAN: 1. CAD: Symptomatically stable. Continue current therapy with aspirin, statin, beta blocker and ACE inhibitor. Will refill all meds.  2. Essential hypertension: Controlled. No changes.  3. Hyperlipidemia: Currently taking Lipitor 20 mg daily. I will obtain a copy of lipids from PCP. Pt said they were normal other than a low HDL.  Dispo: f/u 1 year.   Kate Sable, M.D., F.A.C.C.

## 2015-07-29 NOTE — Addendum Note (Signed)
Addended by: Barbarann Ehlers A on: 07/29/2015 09:12 AM   Modules accepted: Orders

## 2015-08-02 ENCOUNTER — Ambulatory Visit: Payer: Self-pay | Admitting: Cardiovascular Disease

## 2015-11-07 ENCOUNTER — Other Ambulatory Visit (HOSPITAL_COMMUNITY): Payer: Self-pay | Admitting: Family Medicine

## 2015-11-07 ENCOUNTER — Ambulatory Visit (HOSPITAL_COMMUNITY)
Admission: RE | Admit: 2015-11-07 | Discharge: 2015-11-07 | Disposition: A | Discharge status: Home / Self Care | Payer: Medicare Other | Source: Ambulatory Visit | Attending: Family Medicine | Admitting: Family Medicine

## 2015-11-07 DIAGNOSIS — I700 Atherosclerosis of aorta: Secondary | ICD-10-CM | POA: Insufficient documentation

## 2015-11-07 DIAGNOSIS — M47896 Other spondylosis, lumbar region: Secondary | ICD-10-CM | POA: Diagnosis not present

## 2015-11-07 DIAGNOSIS — M545 Low back pain: Secondary | ICD-10-CM

## 2015-12-13 ENCOUNTER — Encounter (HOSPITAL_COMMUNITY): Payer: Self-pay

## 2015-12-13 ENCOUNTER — Emergency Department (HOSPITAL_COMMUNITY)
Admission: EM | Admit: 2015-12-13 | Discharge: 2015-12-13 | Disposition: A | Discharge status: Home / Self Care | Payer: Medicare Other | Attending: Emergency Medicine | Admitting: Emergency Medicine

## 2015-12-13 DIAGNOSIS — Z79899 Other long term (current) drug therapy: Secondary | ICD-10-CM | POA: Insufficient documentation

## 2015-12-13 DIAGNOSIS — T63441A Toxic effect of venom of bees, accidental (unintentional), initial encounter: Secondary | ICD-10-CM | POA: Insufficient documentation

## 2015-12-13 DIAGNOSIS — I10 Essential (primary) hypertension: Secondary | ICD-10-CM | POA: Insufficient documentation

## 2015-12-13 DIAGNOSIS — Z87891 Personal history of nicotine dependence: Secondary | ICD-10-CM | POA: Diagnosis not present

## 2015-12-13 DIAGNOSIS — Z7982 Long term (current) use of aspirin: Secondary | ICD-10-CM | POA: Diagnosis not present

## 2015-12-13 MED ORDER — EPINEPHRINE 0.3 MG/0.3ML IJ SOAJ: 0.3000 mg | Freq: Once | INTRAMUSCULAR | 0 refills | Status: AC

## 2015-12-13 MED ORDER — DIPHENHYDRAMINE HCL 25 MG PO CAPS
25.0000 mg | ORAL_CAPSULE | Freq: Once | ORAL | Status: AC
  Administered 2015-12-13: 25 mg via ORAL
  Filled 2015-12-13: qty 1

## 2015-12-13 MED ORDER — FAMOTIDINE 20 MG PO TABS
20.0000 mg | ORAL_TABLET | Freq: Every day | ORAL | Status: DC
  Administered 2015-12-13: 20 mg via ORAL
  Filled 2015-12-13: qty 1

## 2015-12-13 MED ORDER — PREDNISONE 50 MG PO TABS: ORAL_TABLET | ORAL | 0 refills | Status: DC

## 2015-12-13 MED ORDER — PREDNISONE 50 MG PO TABS
60.0000 mg | ORAL_TABLET | Freq: Once | ORAL | Status: AC
  Administered 2015-12-13: 60 mg via ORAL
  Filled 2015-12-13: qty 1

## 2015-12-13 NOTE — Discharge Instructions (Signed)
Follow up with your doctor. Use the epipen as needed for severe allergic reaction with chest pain or shortness of breath. Return to the ED if you develop new or worsening symptoms.

## 2015-12-13 NOTE — ED Provider Notes (Signed)
[facility] DEPT Provider Note   CSN: [id number] Arrival date & time: 12/13/15  1344     History   Chief Complaint Chief Complaint  Patient presents with  . Allergic Reaction    HPI Brent Brock is a 70 y.o. male.  Patient reports multiple bee stings to right arm and left ankle about 30 minutes prior to arrival. Complains of pain and itching and feeling dizzy and lightheaded and "foggy". Did not take any medications. History of bee allergy in the past but does not remember if he took any medications for this. He denies any chest pain or shortness of breath. Denies any nausea or vomiting. He denies any tongue or lip swelling. Patient with history of CAD with 2 stents as well as hyperlipidemia and hypertension. He does not have an EpiPen and did not use one.   The history is provided by the patient.  Allergic Reaction  Presenting symptoms: rash     Past Medical History:  Diagnosis Date  . Anxiety   . ASCVD (arteriosclerotic cardiovascular disease)     PTCA of LAD in 03/1998; recath a few weeks later-no restenosis; apical MI in 04/1998; restenosis of the LAD-stent placed; rotational atherectomy of first diagonal  . BPH (benign prostatic hyperplasia)   . Chronic prostatitis   . Diverticulosis   . GERD (gastroesophageal reflux disease)   . Hyperlipidemia   . Hypertension   . Past use of tobacco    40 pack years discontinued in 03/1998    Patient Active Problem List   Diagnosis Date Noted  . Tobacco abuse, in remission   . Hyperlipidemia 06/02/2009  . Anxiety 06/02/2009  . Hypertension 06/02/2009  . Arteriosclerotic cardiovascular disease (ASCVD) 06/02/2009  . Gastroesophageal reflux 06/02/2009    Past Surgical History:  Procedure Laterality Date  . CORONARY STENT PLACEMENT     x 2  . POLYPECTOMY  1997   Polyps removed on vocal cords        Home Medications    Prior to Admission medications   Medication Sig Start Date End Date Taking? Authorizing Provider   amLODipine (NORVASC) 5 MG tablet Take 1 tablet (5 mg total) by mouth daily. 07/29/15   Herminio Commons, MD  aspirin EC 81 MG tablet Take 81 mg by mouth daily.    Historical Provider, MD  atorvastatin (LIPITOR) 20 MG tablet Take 1 tablet (20 mg total) by mouth daily. 07/29/15   Herminio Commons, MD  esomeprazole (NEXIUM) 40 MG capsule Take 1 capsule (40 mg total) by mouth daily before breakfast. Patient taking differently: Take 40 mg by mouth daily before breakfast. Taking 1 every other day 09/18/12   Mahala Menghini, PA-C  lisinopril-hydrochlorothiazide (PRINZIDE,ZESTORETIC) 20-12.5 MG tablet Take 1 tablet by mouth 2 (two) times daily. 07/29/15   Herminio Commons, MD  metoprolol succinate (TOPROL-XL) 50 MG 24 hr tablet Take 1 tablet (50 mg total) by mouth daily. 07/29/15   Herminio Commons, MD  nitroGLYCERIN (NITROSTAT) 0.4 MG SL tablet Place 1 tablet (0.4 mg total) under the tongue every 5 (five) minutes as needed for chest pain. 07/29/15   Herminio Commons, MD  tamsulosin (FLOMAX) 0.4 MG CAPS capsule Take 0.4 mg by mouth daily.    Historical Provider, MD    Family History Family History  Problem Relation Age of Onset  . Breast cancer Daughter     16's  . CAD      Social History Social History  Substance Use Topics  .  Smoking status: Former Smoker    Packs/day: 1.00    Years: 30.00    Types: Pipe    Start date: 03/26/1960    Quit date: 06/21/1998  . Smokeless tobacco: Never Used  . Alcohol use No     Allergies   Penicillins and Sulfa antibiotics   Review of Systems Review of Systems  Constitutional: Negative for activity change, appetite change and fever.  HENT: Negative for congestion and rhinorrhea.   Respiratory: Negative for cough, chest tightness and shortness of breath.   Cardiovascular: Negative for chest pain and leg swelling.  Gastrointestinal: Negative for abdominal distention, nausea and vomiting.  Genitourinary: Negative for dysuria, hematuria and urgency.    Musculoskeletal: Negative for arthralgias and joint swelling.  Skin: Positive for rash.  Neurological: Positive for dizziness and light-headedness. Negative for weakness.   A complete 10 system review of systems was obtained and all systems are negative except as noted in the HPI and PMH.    Physical Exam Updated Vital Signs BP 107/71 (BP Location: Left Arm)   Pulse 118   Temp 97.5 F (36.4 C) (Oral)   Resp 20   Ht 5\' 7"  (1.702 m)   Wt 180 lb (81.6 kg)   SpO2 99%   BMI 28.19 kg/m   Physical Exam  Constitutional: He is oriented to person, place, and time. He appears well-developed and well-nourished. No distress.  Anxious, no tongue or lip swelling  HENT:  Head: Normocephalic and atraumatic.  Mouth/Throat: Oropharynx is clear and moist. No oropharyngeal exudate.  No tongue or lip swelling  Eyes: Conjunctivae and EOM are normal. Pupils are equal, round, and reactive to light.  Neck: Normal range of motion. Neck supple.  No meningismus.  Cardiovascular: Normal rate, regular rhythm, normal heart sounds and intact distal pulses.   No murmur heard. Pulmonary/Chest: Effort normal and breath sounds normal. No respiratory distress. He has no wheezes. He has no rales. He exhibits no tenderness.  Abdominal: Soft. There is no tenderness. There is no rebound and no guarding.  Musculoskeletal: Normal range of motion. He exhibits no edema or tenderness.  Neurological: He is alert and oriented to person, place, and time. No cranial nerve deficit. He exhibits normal muscle tone. Coordination normal.  No ataxia on finger to nose bilaterally. No pronator drift. 5/5 strength throughout. CN 2-12 intact.Equal grip strength. Sensation intact.   Skin: Skin is warm. Rash noted.  Erythematous stings to right forearm and elbow and left ankle.  Scattered urticaria on the back  Psychiatric: He has a normal mood and affect. His behavior is normal.  Nursing note and vitals reviewed.    ED Treatments  / Results  Labs (all labs ordered are listed, but only abnormal results are displayed) Labs Reviewed - No data to display  EKG  EKG Interpretation  Date/Time:  Tuesday December 13 2015 14:33:43 EDT Ventricular Rate:  96 PR Interval:    QRS Duration: 96 QT Interval:  339 QTC Calculation: 429 R Axis:   17 Text Interpretation:  Sinus rhythm No significant change was found Confirmed by Wyvonnia Dusky  MD, Zamara Cozad [phone]) on 12/13/2015 2:38:22 PM       Radiology No results found.  Procedures Procedures (including critical care time)  Medications Ordered in ED Medications  famotidine (PEPCID) tablet 20 mg (20 mg Oral Given 12/13/15 1412)  diphenhydrAMINE (BENADRYL) capsule 25 mg (25 mg Oral Given 12/13/15 1412)  predniSONE (DELTASONE) tablet 60 mg (60 mg Oral Given 12/13/15 1412)  Initial Impression / Assessment and Plan / ED Course  I have reviewed the triage vital signs and the nursing notes.  Pertinent labs & imaging results that were available during my care of the patient were reviewed by me and considered in my medical decision making (see chart for details).  Clinical Course   Patient with multiple bee stings prior to arrival. No tongue or lip swelling. No chest pain or shortness of breath.  Patient given prednisone, antihistamines There is no chest pain, shortness of breath, or lip swelling. No indication for epinephrine.  Recheck 4:20 PM. Patient is feeling improved. Denies chest pain or shortness of breath. Denies any further dizziness or foggy feeling.  He is tolerating PO and ambulatory. Will discharge with steroids and antihistamines. Epipen prescribed and patient instructed on use. Return precautions discussed.   Final Clinical Impressions(s) / ED Diagnoses   Final diagnoses:  Bee sting reaction, accidental or unintentional, initial encounter    New Prescriptions New Prescriptions   No medications on file     Ezequiel Essex, MD 12/13/15 2316

## 2015-12-13 NOTE — ED Notes (Signed)
Pt ambulated around nurses desk. No difficulties. Denies dizziness or lightheadedness

## 2015-12-13 NOTE — ED Triage Notes (Signed)
Pt reports was stung several times by yellow jackets around 1330 today.  Pt says c/o feeling "foggy."  Pt did not take any medication prior to coming to ER.  Denies difficulty breathing or swallowing.

## 2015-12-13 NOTE — ED Notes (Signed)
DR. Wyvonnia Dusky at triage assessing pt.

## 2016-01-24 ENCOUNTER — Ambulatory Visit (HOSPITAL_COMMUNITY)
Admission: RE | Admit: 2016-01-24 | Discharge: 2016-01-24 | Disposition: A | Discharge status: Home / Self Care | Payer: Medicare Other | Source: Ambulatory Visit | Attending: Family Medicine | Admitting: Family Medicine

## 2016-01-24 ENCOUNTER — Other Ambulatory Visit (HOSPITAL_COMMUNITY): Payer: Self-pay | Admitting: Family Medicine

## 2016-01-24 DIAGNOSIS — M1712 Unilateral primary osteoarthritis, left knee: Secondary | ICD-10-CM | POA: Insufficient documentation

## 2016-01-24 DIAGNOSIS — M25562 Pain in left knee: Secondary | ICD-10-CM | POA: Diagnosis present

## 2016-06-25 ENCOUNTER — Other Ambulatory Visit (HOSPITAL_COMMUNITY): Payer: Self-pay | Admitting: Family Medicine

## 2016-06-25 DIAGNOSIS — R42 Dizziness and giddiness: Secondary | ICD-10-CM

## 2016-06-25 DIAGNOSIS — I7090 Unspecified atherosclerosis: Secondary | ICD-10-CM

## 2016-06-25 DIAGNOSIS — I700 Atherosclerosis of aorta: Secondary | ICD-10-CM

## 2016-06-27 ENCOUNTER — Ambulatory Visit (HOSPITAL_COMMUNITY)
Admission: RE | Admit: 2016-06-27 | Discharge: 2016-06-27 | Disposition: A | Discharge status: Home / Self Care | Payer: Medicare Other | Source: Ambulatory Visit | Attending: Family Medicine | Admitting: Family Medicine

## 2016-06-27 DIAGNOSIS — R42 Dizziness and giddiness: Secondary | ICD-10-CM | POA: Diagnosis present

## 2016-06-27 DIAGNOSIS — R140 Abdominal distension (gaseous): Secondary | ICD-10-CM | POA: Diagnosis not present

## 2016-06-27 DIAGNOSIS — I700 Atherosclerosis of aorta: Secondary | ICD-10-CM | POA: Insufficient documentation

## 2016-06-27 DIAGNOSIS — I2510 Atherosclerotic heart disease of native coronary artery without angina pectoris: Secondary | ICD-10-CM | POA: Diagnosis not present

## 2016-06-27 DIAGNOSIS — R0989 Other specified symptoms and signs involving the circulatory and respiratory systems: Secondary | ICD-10-CM | POA: Diagnosis present

## 2016-08-02 ENCOUNTER — Other Ambulatory Visit: Payer: Self-pay | Admitting: Cardiovascular Disease

## 2016-08-08 ENCOUNTER — Encounter: Payer: Self-pay | Admitting: Cardiovascular Disease

## 2016-08-08 ENCOUNTER — Ambulatory Visit (INDEPENDENT_AMBULATORY_CARE_PROVIDER_SITE_OTHER): Payer: Medicare Other | Admitting: Cardiovascular Disease

## 2016-08-08 VITALS — BP 118/84 | HR 76 | Ht 66.0 in | Wt 182.0 lb

## 2016-08-08 DIAGNOSIS — I10 Essential (primary) hypertension: Secondary | ICD-10-CM

## 2016-08-08 DIAGNOSIS — I2510 Atherosclerotic heart disease of native coronary artery without angina pectoris: Secondary | ICD-10-CM

## 2016-08-08 DIAGNOSIS — Z01818 Encounter for other preprocedural examination: Secondary | ICD-10-CM | POA: Diagnosis not present

## 2016-08-08 DIAGNOSIS — E782 Mixed hyperlipidemia: Secondary | ICD-10-CM

## 2016-08-08 NOTE — Progress Notes (Signed)
SUBJECTIVE: The patient returns for routine follow up. He has a history of a myocardial infarction several years ago requiring percutaneous coronary intervention. He also has a history of hypertension, hyperlipidemia, and a former history of tobacco abuse.  He has been doing very well and denies chest pain, consistent exertional dyspnea, palpitations, orthopnea, leg swelling, and dizziness.  He had a left knee injury with meniscal tear and requires surgery.  A review of labs performed on 06/12/16 showed total cholesterol 146, HDL 40, triglycerides 113, LDL 85, A1c 5.6%, hemoglobin 15.6, BUN 16, creatinine 1.12.   Soc: His deceased wife, Barnetta Chapel, used to be my patient. 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 Has 3 grandchildren, a grandson who lives with him and attends [employer], a granddaughter attends [employer].  Review of Systems: As per "subjective", otherwise negative.  Allergies  Allergen Reactions  . Penicillins Other (See Comments)    Thrush  . Sulfa Antibiotics Rash    Current Outpatient Prescriptions  Medication Sig Dispense Refill  . amLODipine (NORVASC) 5 MG tablet Take 1 tablet (5 mg total) by mouth daily. 90 tablet 3  . aspirin EC 81 MG tablet Take 81 mg by mouth daily.    Marland Kitchen atorvastatin (LIPITOR) 20 MG tablet TAKE ONE (1) TABLET BY MOUTH EVERY DAY 30 tablet 0  . esomeprazole (NEXIUM) 40 MG capsule Take 40 mg by mouth every other day.    . lisinopril-hydrochlorothiazide (PRINZIDE,ZESTORETIC) 20-12.5 MG tablet Take 1 tablet by mouth daily.    . metoprolol succinate (TOPROL-XL) 50 MG 24 hr tablet Take 1 tablet (50 mg total) by mouth daily. 90 tablet 3  . nitroGLYCERIN (NITROSTAT) 0.4 MG SL tablet Place 1 tablet (0.4 mg total) under the tongue every 5 (five) minutes as needed for chest pain. 25 tablet 3  . tamsulosin (FLOMAX) 0.4 MG CAPS capsule Take 0.4 mg by mouth daily.    . tamsulosin (FLOMAX) 0.4 MG CAPS capsule Take 0.4  mg by mouth every other day.     No current facility-administered medications for this visit.     Past Medical History:  Diagnosis Date  . Anxiety   . ASCVD (arteriosclerotic cardiovascular disease)     PTCA of LAD in 03/1998; recath a few weeks later-no restenosis; apical MI in 04/1998; restenosis of the LAD-stent placed; rotational atherectomy of first diagonal  . BPH (benign prostatic hyperplasia)   . Chronic prostatitis   . Diverticulosis   . GERD (gastroesophageal reflux disease)   . Hyperlipidemia   . Hypertension   . Past use of tobacco    40 pack years discontinued in 03/1998    Past Surgical History:  Procedure Laterality Date  . CORONARY STENT PLACEMENT     x 2  . POLYPECTOMY  1997   Polyps removed on vocal cords     Social History   Social History  . Marital status: Widowed    Spouse name: N/A  . Number of children: N/A  . Years of education: N/A   Occupational History  . Professor     [employer]   Social History Main Topics  . Smoking status: Former Smoker    Packs/day: 1.00    Years: 30.00    Types: Pipe    Start date: 03/26/1960    Quit date: 06/21/1998  . Smokeless tobacco: Never Used  . Alcohol use No  . Drug use: No  . Sexual activity: Not on file   Other  Topics Concern  . 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 Neck: No JVD, no thyromegaly. Lungs: Clear to auscultation bilaterally with normal respiratory effort. CV: Nondisplaced PMI.  Regular rate and rhythm, normal S1/S2, no S3/S4, no murmur. No pretibial or periankle edema.  No  carotid bruit.   Abdomen: Soft, nontender, no distention.  Neurologic: Alert and oriented.  Psych: Normal affect. Skin: Normal. Musculoskeletal: No gross deformities.    ECG: Most recent ECG reviewed.   Labs: Lab Results  Component Value Date/Time   K 3.4 (L) 12/04/2013 11:45 AM   K 4.0 11/12/2011   BUN 16 12/04/2013 11:45 AM   BUN 14 11/12/2011   CREATININE 1.09 12/04/2013 11:45 AM   CREATININE 1.08 06/12/2012 12:20 PM   ALT 22 07/27/2013 02:01 PM   TSH 4.42 11/12/2011   HGB 14.8 12/04/2013 11:45 AM     Lipids: Lab Results  Component Value Date/Time   LDLCALC 77 07/27/2013 02:01 PM   CHOL 134 07/27/2013 02:01 PM   TRIG 80 07/27/2013 02:01 PM   TRIG 163 05/19/2009   HDL 41 07/27/2013 02:01 PM       ASSESSMENT AND PLAN:  1. CAD: Symptomatically stable. Continue therapy with aspirin, Lipitor, and metoprolol.  2. Essential hypertension: Controlled on amlodipine 5 mg, lisinopril 20 mg, hydrochlorothiazide 12.5 mg, and Toprol-XL 50 mg. No changes.  3. Hyperlipidemia: Lipids reviewed above. Continue Lipitor 20 mg.  4. Preoperative risk stratification: He is at a low risk for major adverse cardiac events in the perioperative period. If feasible, I would recommend continuing aspirin, beta blocker, and statin therapy throughout this time.    Disposition: Follow up 1 year.  Kate Sable, M.D., F.A.C.C.

## 2016-08-08 NOTE — Patient Instructions (Signed)

## 2016-08-27 ENCOUNTER — Other Ambulatory Visit: Payer: Self-pay | Admitting: Cardiovascular Disease

## 2016-08-30 ENCOUNTER — Other Ambulatory Visit: Payer: Self-pay | Admitting: Cardiovascular Disease

## 2016-10-23 ENCOUNTER — Other Ambulatory Visit: Payer: Self-pay | Admitting: Cardiovascular Disease

## 2016-12-25 ENCOUNTER — Other Ambulatory Visit (HOSPITAL_COMMUNITY): Payer: Self-pay | Admitting: Family Medicine

## 2016-12-25 DIAGNOSIS — R1032 Left lower quadrant pain: Secondary | ICD-10-CM

## 2017-01-03 ENCOUNTER — Ambulatory Visit (HOSPITAL_COMMUNITY)
Admission: RE | Admit: 2017-01-03 | Discharge: 2017-01-03 | Disposition: A | Discharge status: Home / Self Care | Payer: Medicare Other | Source: Ambulatory Visit | Attending: Family Medicine | Admitting: Family Medicine

## 2017-01-03 DIAGNOSIS — I2510 Atherosclerotic heart disease of native coronary artery without angina pectoris: Secondary | ICD-10-CM | POA: Insufficient documentation

## 2017-01-03 DIAGNOSIS — I700 Atherosclerosis of aorta: Secondary | ICD-10-CM | POA: Insufficient documentation

## 2017-01-03 DIAGNOSIS — K5730 Diverticulosis of large intestine without perforation or abscess without bleeding: Secondary | ICD-10-CM | POA: Insufficient documentation

## 2017-01-03 DIAGNOSIS — R1032 Left lower quadrant pain: Secondary | ICD-10-CM | POA: Insufficient documentation

## 2017-01-03 LAB — POCT I-STAT CREATININE: Creatinine, Ser: 1.2 mg/dL (ref 0.61–1.24)

## 2017-01-03 MED ORDER — IOPAMIDOL (ISOVUE-300) INJECTION 61%
100.0000 mL | Freq: Once | INTRAVENOUS | Status: AC | PRN
  Administered 2017-01-03: 100 mL via INTRAVENOUS

## 2017-01-21 ENCOUNTER — Other Ambulatory Visit: Payer: Self-pay | Admitting: Cardiovascular Disease

## 2017-02-08 ENCOUNTER — Encounter: Payer: Self-pay | Admitting: Internal Medicine

## 2017-02-26 ENCOUNTER — Ambulatory Visit: Payer: Medicare Other | Admitting: Internal Medicine

## 2017-02-28 ENCOUNTER — Ambulatory Visit: Payer: Self-pay | Admitting: Gastroenterology

## 2017-02-28 ENCOUNTER — Other Ambulatory Visit: Payer: Self-pay | Admitting: Cardiovascular Disease

## 2017-04-10 ENCOUNTER — Encounter: Payer: Self-pay | Admitting: General Practice

## 2017-04-10 ENCOUNTER — Telehealth: Payer: Self-pay | Admitting: Internal Medicine

## 2017-04-10 NOTE — Telephone Encounter (Signed)
Dr [name] sent [place] a referral on the patient and I made him an OV for 02/26/2017. Pt cancelled OV and said he wanted to wait until after the holidays to reschedule. Pt now has an OV with [facility] on 04/17/2017.

## 2017-04-10 NOTE — Telephone Encounter (Signed)
Per Dr. Gala Romney ok to send patient a discharge letter.

## 2017-04-10 NOTE — Telephone Encounter (Signed)
Noted.  That's fine.

## 2017-04-10 NOTE — Telephone Encounter (Signed)
Noted  

## 2017-04-17 ENCOUNTER — Encounter: Payer: Self-pay | Admitting: Gastroenterology

## 2017-04-17 ENCOUNTER — Ambulatory Visit: Payer: Medicare Other | Admitting: Gastroenterology

## 2017-04-17 ENCOUNTER — Encounter (INDEPENDENT_AMBULATORY_CARE_PROVIDER_SITE_OTHER): Payer: Self-pay

## 2017-04-17 VITALS — BP 120/80 | HR 100 | Ht 67.0 in | Wt 184.0 lb

## 2017-04-17 DIAGNOSIS — K5731 Diverticulosis of large intestine without perforation or abscess with bleeding: Secondary | ICD-10-CM

## 2017-04-17 DIAGNOSIS — R1011 Right upper quadrant pain: Secondary | ICD-10-CM

## 2017-04-17 DIAGNOSIS — K625 Hemorrhage of anus and rectum: Secondary | ICD-10-CM

## 2017-04-17 NOTE — Patient Instructions (Signed)
Food Guidelines for a sensitive stomach  Many people have difficulty digesting certain foods, causing a variety of distressing and embarrassing symptoms such as abdominal pain, bloating and gas.  These foods may need to be avoided or consumed in small amounts.  Here are some tips that might be helpful for you.  1.   Lactose intolerance is the difficulty or complete inability to digest lactose, the natural sugar in milk and anything made from milk.  This condition is harmless, common, and can begin any time during life.  Some people can digest a modest amount of lactose while others cannot tolerate any.  Also, not all dairy products contain equal amounts of lactose.  For example, hard cheeses such as parmesan have less lactose than soft cheeses such as cheddar.  Yogurt has less lactose than milk or cheese.  Many packaged foods (even many brands of bread) have milk, so read ingredient lists carefully.  It is difficult to test for lactose intolerance, so just try avoiding lactose as much as possible for a week and see what happens with your symptoms.  If you seem to be lactose intolerant, the best plan is to avoid it (but make sure you get calcium from another source).  The next best thing is to use lactase enzyme supplements, available over the counter everywhere.  Just know that many lactose intolerant people need to take several tablets with each serving of dairy to avoid symptoms.  Lastly, a lot of restaurant food is made with milk or butter.  Many are things you might not suspect, such as mashed potatoes, rice and pasta (cooked with butter) and "grilled" items.  If you are lactose intolerant, it never hurts to ask your server what has milk or butter.  2.   Fiber is an important part of your diet, but not all fiber is well-tolerated.  Insoluble fiber such as bran is often consumed by normal gut bacteria and converted into gas.  Soluble fiber such as oats, squash, carrots and green beans are typically  tolerated better.  3.   Some types of carbohydrates can be poorly digested.  Examples include: fructose (apples, cherries, pears, raisins and other dried fruits), fructans (onions, zucchini, large amounts of wheat), sorbitol/mannitol/xylitol and sucralose/Splenda (common artificial sweeteners), and raffinose (lentils, broccoli, cabbage, asparagus, brussel sprouts, many types of beans).  Do a Development worker, community for [employer] and you will find helpful information. Beano, a dietary supplement, will often help with raffinose-containing foods.  As with lactase tablets, you may need several per serving.  4.   Whenever possible, avoid processed food&meats and chemical additives.  High fructose corn syrup, a common sweetener, may be difficult to digest.  Eggs and soy (comes from the soybean, and added to many foods now) are the other most common bloating/gassy foods.  - Dr. [facility]

## 2017-04-17 NOTE — Progress Notes (Signed)
[facility] Consult Note:  History: Brent Brock 04/17/2017  Referring physician: Lemmie Evens, MD  Reason for consult/chief complaint: Abdominal Pain (LLQ discomfort) and Diverticulosis (last flare up )   Subjective  HPI:  This is a 72 year old man referred by Dr. [name] for abdominal pain and reported history of diverticulitis. Most recent office note from that clinic on 02/20/2017 reports that a CT scan from January 03, 2017 showed colonic diverticulosis without inflammation.  Month prior he had lower abdominal pain that was treated for presumed diverticulitis with both ciprofloxacin and Flagyl, but the patient apparently only took the ciprofloxacin.  He has had intermittent right-sided abdominal pain that is predominantly upper and is bloating nature, sometimes worse after meals.  He has not noticed any other clear triggers or relieving factors, and the pain is nonradiating.  Bowel habits are reportedly regular with a few episodes of small volume anorectal bleeding that he has attributed to hemorrhoids and discussed with his primary care provider. He was seen by the [facility] group in 2014 for right upper quadrant pain.  According to the most recent note from that year, that pain improved on Nexium, patient had had a negative ultrasound and HIDA scan.  He declined EGD.  He also declined colonoscopy in the past out of concern for possible complications, since this apparently happened to his late wife.  ROS:  Review of Systems  Constitutional: Negative for appetite change and unexpected weight change.  HENT: Negative for mouth sores and voice change.   Eyes: Negative for pain and redness.  Respiratory: Negative for cough and shortness of breath.   Cardiovascular: Negative for chest pain and palpitations.  Genitourinary: Negative for dysuria and hematuria.  Musculoskeletal: Negative for arthralgias and myalgias.  Skin: Negative for pallor and rash.    Neurological: Negative for weakness and headaches.  Hematological: Negative for adenopathy.     Past Medical History: Past Medical History:  Diagnosis Date  . Anxiety   . ASCVD (arteriosclerotic cardiovascular disease)     PTCA of LAD in 03/1998; recath a few weeks later-no restenosis; apical MI in 04/1998; restenosis of the LAD-stent placed; rotational atherectomy of first diagonal  . BPH (benign prostatic hyperplasia)   . Chronic prostatitis   . Diverticulosis   . Elevated PSA   . Enlarged prostate   . GERD (gastroesophageal reflux disease)   . History of coronary angioplasty   . Hyperlipidemia   . Hypertension   . Nicotine dependence   . Past use of tobacco    40 pack years discontinued in 03/1998  . Seborrheic keratoses   . Spondylolysis      Past Surgical History: Past Surgical History:  Procedure Laterality Date  . COLONOSCOPY    . CORONARY STENT PLACEMENT     x 2  . POLYPECTOMY  1997   Polyps removed on vocal cords      Family History: Family History  Problem Relation Age of Onset  . Heart attack Mother   . Heart attack Father   . Heart disease Father   . Breast cancer Daughter        age 40   . CAD Unknown   . Colon cancer Neg Hx   no CRC  Social History: Social History   Socioeconomic History  . Marital status: Widowed    Spouse name: None  . Number of children: None  . Years of education: None  . Highest education level: None  Social Needs  . Financial  resource strain: None  . Food insecurity - worry: None  . Food insecurity - inability: None  . Transportation needs - medical: None  . Transportation needs - non-medical: None  Occupational History  . Occupation: Professor    Comment: [employer]  Tobacco Use  . Smoking status: Former Smoker    Packs/day: 1.00    Years: 30.00    Pack years: 30.00    Types: Pipe    Start date: 03/26/1960    Last attempt to quit: 06/21/1998    Years since quitting: 18.8  . Smokeless  tobacco: Never Used  Substance and Sexual Activity  . Alcohol use: No    Alcohol/week: 0.0 oz  . Drug use: No  . Sexual activity: No  Other Topics Concern  . 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    Allergies: Allergies  Allergen Reactions  . Penicillins Other (See Comments)    Thrush  . Sulfa Antibiotics Rash    Outpatient Meds: Current Outpatient Medications  Medication Sig Dispense Refill  . amLODipine (NORVASC) 5 MG tablet TAKE ONE TABLET BY MOUTH EVERY DAY. 90 tablet 3  . aspirin EC 81 MG tablet Take 81 mg by mouth daily.    Marland Kitchen atorvastatin (LIPITOR) 20 MG tablet TAKE ONE (1) TABLET BY MOUTH EVERY DAY 30 tablet 6  . esomeprazole (NEXIUM) 40 MG capsule Take 40 mg by mouth every other day.    . lisinopril-hydrochlorothiazide (PRINZIDE,ZESTORETIC) 20-12.5 MG tablet Take 1 tablet by mouth daily.    . metoprolol succinate (TOPROL-XL) 50 MG 24 hr tablet TAKE ONE (1) TABLET BY MOUTH EVERY DAY 90 tablet 3  . nitroGLYCERIN (NITROSTAT) 0.4 MG SL tablet Place 1 tablet (0.4 mg total) under the tongue every 5 (five) minutes as needed for chest pain. 25 tablet 3  . tamsulosin (FLOMAX) 0.4 MG CAPS capsule Take 0.4 mg by mouth every other day.     No current facility-administered medications for this visit.       ___________________________________________________________________ Objective   Exam:  BP 120/80   Pulse 100   Ht 5\' 7"  (1.702 m)   Wt 184 lb (83.5 kg)   BMI 28.82 kg/m    General: this is a(n) well-appearing man  Eyes: sclera anicteric, no redness  ENT: oral mucosa moist without lesions, no cervical or supraclavicular lymphadenopathy, good dentition  CV: RRR without murmur, S1/S2, no JVD, no peripheral edema  Resp: clear to auscultation bilaterally, normal RR and effort noted  GI: soft, mild right-sided tenderness, with active bowel sounds. No guarding or palpable  organomegaly noted.  Skin; warm and dry, no rash or jaundice noted  Neuro: awake, alert and oriented x 3. Normal gross motor function and fluent speech  Labs:  Normal CBC 12/25/16  Radiologic Studies:  CTAP with contrast on 01/03/2017 done with [facility] radiology shows left-sided diverticulosis without inflammation.  There is also an enlarged prostate gland and incidental right coronary atherosclerosis.  Assessment: Encounter Diagnoses  Name Primary?  . RUQ pain Yes  . Diverticulosis of colon with hemorrhage     He has vague right-sided abdominal pain of unclear cause.  It is not from diverticulitis, and the CT scan was unremarkable other than noninflamed sigmoid diverticulosis. This sounds probably benign,  and also seems to have been going on at least since his 2014 visits with the other GI group.  Nevertheless,  the problem continues and he has had some bleeding as well.  I advised him to have a colonoscopy to rule out colon polyps and cancer, and he would like to return to see me in a few months after he has had a chance to think about it.  Plan:  Some gas and bloating dietary advice was given. Appointment was made for early April.  Thank you for the courtesy of this consult.  Please call me with any questions or concerns.  Nelida Meuse III  CC: Lemmie Evens, MD

## 2017-06-28 DIAGNOSIS — M1712 Unilateral primary osteoarthritis, left knee: Secondary | ICD-10-CM | POA: Insufficient documentation

## 2017-07-02 ENCOUNTER — Ambulatory Visit: Payer: Self-pay | Admitting: Gastroenterology

## 2017-08-12 ENCOUNTER — Other Ambulatory Visit: Payer: Self-pay

## 2017-08-12 ENCOUNTER — Ambulatory Visit: Payer: Medicare Other | Admitting: Cardiovascular Disease

## 2017-08-12 ENCOUNTER — Encounter: Payer: Self-pay | Admitting: *Deleted

## 2017-08-12 VITALS — BP 112/82 | HR 75 | Ht 67.0 in | Wt 180.6 lb

## 2017-08-12 DIAGNOSIS — I10 Essential (primary) hypertension: Secondary | ICD-10-CM

## 2017-08-12 DIAGNOSIS — I25118 Atherosclerotic heart disease of native coronary artery with other forms of angina pectoris: Secondary | ICD-10-CM

## 2017-08-12 DIAGNOSIS — E785 Hyperlipidemia, unspecified: Secondary | ICD-10-CM | POA: Diagnosis not present

## 2017-08-12 MED ORDER — AMLODIPINE BESYLATE 5 MG PO TABS: 5.0000 mg | ORAL_TABLET | Freq: Every day | ORAL | 3 refills | Status: DC

## 2017-08-12 MED ORDER — METOPROLOL SUCCINATE ER 50 MG PO TB24: ORAL_TABLET | ORAL | 3 refills | Status: DC

## 2017-08-12 MED ORDER — LISINOPRIL-HYDROCHLOROTHIAZIDE 20-12.5 MG PO TABS
1.0000 | ORAL_TABLET | Freq: Every day | ORAL | 3 refills | Status: DC
Start: 2017-08-12 — End: 2017-08-12

## 2017-08-12 MED ORDER — NITROGLYCERIN 0.4 MG SL SUBL: 0.4000 mg | SUBLINGUAL_TABLET | SUBLINGUAL | 3 refills | Status: DC | PRN

## 2017-08-12 MED ORDER — ATORVASTATIN CALCIUM 20 MG PO TABS: ORAL_TABLET | ORAL | 3 refills | Status: DC

## 2017-08-12 MED ORDER — LISINOPRIL-HYDROCHLOROTHIAZIDE 20-12.5 MG PO TABS: 1.0000 | ORAL_TABLET | Freq: Every day | ORAL | 3 refills | Status: DC

## 2017-08-12 NOTE — Patient Instructions (Signed)
Your physician recommends that you schedule a follow-up appointment in: 1 year with Dr Bronson Ing   Your physician recommends that you continue on your current medications as directed. Please refer to the Current Medication list given to you today.    If you need a refill on your cardiac medications before your next appointment, please call your pharmacy.    No lab work or tests ordered today      Thank you for choosing [facility] !

## 2017-08-12 NOTE — Progress Notes (Signed)
SUBJECTIVE: The patient returns for routine follow up. He has a history of a myocardial infarction several years ago requiring percutaneous coronary intervention. He also has a history of hypertension, hyperlipidemia, and a former history of tobacco abuse.  The patient denies any symptoms of chest pain, palpitations, shortness of breath, lightheadedness, dizziness, leg swelling, orthopnea, PND, and syncope.   He had a left knee injury with meniscal tear and requires surgery but tells me he's scared as he plans on taking a trip with his 2 granddaughters to [place] and [place] and does not want anything to get in the way of this.  I reviewed labs performed 12/25/2016: Total cholesterol 134, HDL 42, triglycerides 129, LDL 71, normal CBC, BUN 14, creatinine 1.14.  He underwent a CT abdomen in the fall 2018 which also showed right coronary artery disease.  ECG performed in the office today which I ordered and personally interpreted demonstrates normal sinus rhythm with no ischemic ST segment or T-wave abnormalities, nor any arrhythmias.    Soc Hx: His deceased wife, Barnetta Chapel, used to be my patient. 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 Has 3 grandchildren, a grandson who lives with him, and a granddaughter attends [employer].    Review of Systems: As per "subjective", otherwise negative.  Allergies  Allergen Reactions  . Penicillins Other (See Comments)    Thrush  . Sulfa Antibiotics Rash    Current Outpatient Medications  Medication Sig Dispense Refill  . amLODipine (NORVASC) 5 MG tablet TAKE ONE TABLET BY MOUTH EVERY DAY. 90 tablet 3  . aspirin EC 81 MG tablet Take 81 mg by mouth daily.    Marland Kitchen atorvastatin (LIPITOR) 20 MG tablet TAKE ONE (1) TABLET BY MOUTH EVERY DAY 30 tablet 6  . esomeprazole (NEXIUM) 40 MG capsule Take 40 mg by mouth every other day.    . lisinopril-hydrochlorothiazide (PRINZIDE,ZESTORETIC)  20-12.5 MG tablet Take 1 tablet by mouth daily.    . metoprolol succinate (TOPROL-XL) 50 MG 24 hr tablet TAKE ONE (1) TABLET BY MOUTH EVERY DAY 90 tablet 3  . nitroGLYCERIN (NITROSTAT) 0.4 MG SL tablet Place 1 tablet (0.4 mg total) under the tongue every 5 (five) minutes as needed for chest pain. 25 tablet 3   No current facility-administered medications for this visit.     Past Medical History:  Diagnosis Date  . Anxiety   . ASCVD (arteriosclerotic cardiovascular disease)     PTCA of LAD in 03/1998; recath a few weeks later-no restenosis; apical MI in 04/1998; restenosis of the LAD-stent placed; rotational atherectomy of first diagonal  . BPH (benign prostatic hyperplasia)   . Chronic prostatitis   . Diverticulosis   . Elevated PSA   . Enlarged prostate   . GERD (gastroesophageal reflux disease)   . History of coronary angioplasty   . Hyperlipidemia   . Hypertension   . Nicotine dependence   . Past use of tobacco    40 pack years discontinued in 03/1998  . Seborrheic keratoses   . Spondylolysis     Past Surgical History:  Procedure Laterality Date  . COLONOSCOPY    . CORONARY STENT PLACEMENT     x 2  . POLYPECTOMY  1997   Polyps removed on vocal cords     Social History   Socioeconomic History  . Marital status: Widowed    Spouse name: Not on file  . Number of children: Not on file  . Years  of education: Not on file  . Highest education level: Not on file  Occupational History  . Occupation: Professor    Comment: [employer]  . Financial resource strain: Not on file  . Food insecurity:    Worry: Not on file    Inability: Not on file  . Transportation needs:    Medical: Not on file    Non-medical: Not on file  Tobacco Use  . Smoking status: Former Smoker    Packs/day: 1.00    Years: 30.00    Pack years: 30.00    Types: Pipe    Start date: 03/26/1960    Last attempt to quit: 06/21/1998    Years since quitting: 19.1  . Smokeless  tobacco: Never Used  Substance and Sexual Activity  . Alcohol use: No    Alcohol/week: 0.0 oz  . Drug use: No  . Sexual activity: Never  Lifestyle  . Physical activity:    Days per week: Not on file    Minutes per session: Not on file  . Stress: Not on file  Relationships  . Social connections:    Talks on phone: Not on file    Gets together: Not on file    Attends religious service: Not on file    Active member of club or organization: Not on file    Attends meetings of clubs or organizations: Not on file    Relationship status: Not on file  . Intimate partner violence:    Fear of current or ex partner: Not on file    Emotionally abused: Not on file    Physically abused: Not on file    Forced sexual activity: Not on file  Other Topics Concern  . 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 Neck: No JVD, no thyromegaly. Lungs: Clear to auscultation bilaterally with normal respiratory effort. CV: Regular rate and rhythm, normal S1/S2, no S3/S4, no murmur. No pretibial or periankle edema.  No carotid bruit.   Abdomen: Soft, nontender, no distention.  Neurologic: Alert and oriented.  Psych: Normal affect. Skin: Normal. Musculoskeletal: No gross deformities.    ECG: Most recent ECG reviewed.   Labs: Lab Results  Component Value Date/Time   K 3.4 (L) 12/04/2013 11:45 AM   K 4.0 11/12/2011   BUN 16 12/04/2013 11:45 AM   BUN 14 11/12/2011   CREATININE 1.20 01/03/2017 03:06 PM   CREATININE 1.08 06/12/2012 12:20 PM   ALT 22 07/27/2013 02:01 PM   TSH 4.42 11/12/2011   HGB  14.8 12/04/2013 11:45 AM     Lipids: Lab Results  Component Value Date/Time   LDLCALC 77 07/27/2013 02:01 PM   CHOL 134 07/27/2013 02:01 PM   TRIG 80 07/27/2013 02:01 PM   TRIG 163 05/19/2009   HDL 41 07/27/2013 02:01 PM       ASSESSMENT AND PLAN: 1. CAD: Symptomatically stable. Continue therapy with aspirin, Lipitor, and metoprolol.  2. Essential hypertension:  Controlled on amlodipine 5 mg, lisinopril 20 mg, hydrochlorothiazide 12.5 mg, and Toprol-XL 50 mg. No changes.  3. Hyperlipidemia: Lipids reviewed above. Continue Lipitor 20 mg.      Disposition: Follow up 1 year.   Kate Sable, M.D., F.A.C.C.

## 2018-02-11 ENCOUNTER — Ambulatory Visit (HOSPITAL_COMMUNITY)
Admission: RE | Admit: 2018-02-11 | Discharge: 2018-02-11 | Disposition: A | Discharge status: Home / Self Care | Payer: Medicare Other | Source: Ambulatory Visit | Attending: Family Medicine | Admitting: Family Medicine

## 2018-02-11 ENCOUNTER — Other Ambulatory Visit (HOSPITAL_COMMUNITY): Payer: Self-pay | Admitting: Family Medicine

## 2018-02-11 DIAGNOSIS — M19032 Primary osteoarthritis, left wrist: Secondary | ICD-10-CM | POA: Diagnosis not present

## 2018-02-11 DIAGNOSIS — M79645 Pain in left finger(s): Secondary | ICD-10-CM | POA: Diagnosis not present

## 2018-02-11 DIAGNOSIS — M25532 Pain in left wrist: Secondary | ICD-10-CM

## 2018-02-25 ENCOUNTER — Telehealth: Payer: Self-pay | Admitting: Cardiovascular Disease

## 2018-02-25 NOTE — Telephone Encounter (Signed)
Pt called requesting to speak w/ a nurse regarding some questions he has about his medications.

## 2018-02-25 NOTE — Telephone Encounter (Signed)
PCP wants to give patient Naproxsyn for arthritis, patient is going to try tylenol arthritis first

## 2018-04-07 ENCOUNTER — Other Ambulatory Visit (HOSPITAL_COMMUNITY): Payer: Self-pay | Admitting: Family Medicine

## 2018-04-07 ENCOUNTER — Ambulatory Visit (HOSPITAL_COMMUNITY)
Admission: RE | Admit: 2018-04-07 | Discharge: 2018-04-07 | Disposition: A | Discharge status: Home / Self Care | Payer: Medicare Other | Source: Ambulatory Visit | Attending: Family Medicine | Admitting: Family Medicine

## 2018-04-07 DIAGNOSIS — G8929 Other chronic pain: Secondary | ICD-10-CM

## 2018-04-07 DIAGNOSIS — M545 Low back pain: Secondary | ICD-10-CM | POA: Diagnosis present

## 2018-08-28 ENCOUNTER — Telehealth: Payer: Self-pay | Admitting: Cardiovascular Disease

## 2018-08-28 NOTE — Telephone Encounter (Signed)
Virtual Visit Pre-Appointment Phone Call  "(Name), I am calling you today to discuss your upcoming appointment. We are currently trying to limit exposure to the virus that causes COVID-19 by seeing patients at home rather than in the office."  1. "What is the BEST phone number to call the day of the visit?" - include this in appointment notes  2. Do you have or have access to (through a family member/friend) a smartphone with video capability that we can use for your visit?" a. If yes - list this number in appt notes as cell (if different from BEST phone #) and list the appointment type as a VIDEO visit in appointment notes b. If no - list the appointment type as a PHONE visit in appointment notes  3. Confirm consent - "In the setting of the current Covid19 crisis, you are scheduled for a (phone or video) visit with your provider on (date) at (time).  Just as we do with many in-office visits, in order for you to participate in this visit, we must obtain consent.  If you'd like, I can send this to your mychart (if signed up) or email for you to review.  Otherwise, I can obtain your verbal consent now.  All virtual visits are billed to your insurance company just like a normal visit would be.  By agreeing to a virtual visit, we'd like you to understand that the technology does not allow for your provider to perform an examination, and thus may limit your provider's ability to fully assess your condition. If your provider identifies any concerns that need to be evaluated in person, we will make arrangements to do so.  Finally, though the technology is pretty good, we cannot assure that it will always work on either your or our end, and in the setting of a video visit, we may have to convert it to a phone-only visit.  In either situation, we cannot ensure that we have a secure connection.  Are you willing to proceed?" STAFF: Did the patient verbally acknowledge consent to telehealth visit? Document  YES/NO here: Yes  4. Advise patient to be prepared - "Two hours prior to your appointment, go ahead and check your blood pressure, pulse, oxygen saturation, and your weight (if you have the equipment to check those) and write them all down. When your visit starts, your provider will ask you for this information. If you have an Apple Watch or Kardia device, please plan to have heart rate information ready on the day of your appointment. Please have a pen and paper handy nearby the day of the visit as well."  5. Give patient instructions for MyChart download to smartphone OR [email or web address] as below if video visit (depending on what platform provider is using)  6. Inform patient they will receive a phone call 15 minutes prior to their appointment time (may be from unknown caller ID) so they should be prepared to answer    TELEPHONE CALL NOTE  Brent Brock has been deemed a candidate for a follow-up tele-health visit to limit community exposure during the Covid-19 pandemic. I spoke with the patient via phone to ensure availability of phone/video source, confirm preferred email & phone number, and discuss instructions and expectations.  I reminded Brent Brock to be prepared with any vital sign and/or heart rhythm information that could potentially be obtained via home monitoring, at the time of his visit. I reminded Brent Brock to expect a phone call prior to  his visit.  Brent Brock 08/28/2018 11:47 AM

## 2018-09-01 ENCOUNTER — Encounter: Payer: Self-pay | Admitting: Cardiovascular Disease

## 2018-09-01 ENCOUNTER — Telehealth (INDEPENDENT_AMBULATORY_CARE_PROVIDER_SITE_OTHER): Payer: Medicare Other | Admitting: Cardiovascular Disease

## 2018-09-01 VITALS — BP 108/78 | HR 92 | Ht 66.5 in | Wt 175.0 lb

## 2018-09-01 DIAGNOSIS — E785 Hyperlipidemia, unspecified: Secondary | ICD-10-CM

## 2018-09-01 DIAGNOSIS — I25118 Atherosclerotic heart disease of native coronary artery with other forms of angina pectoris: Secondary | ICD-10-CM

## 2018-09-01 DIAGNOSIS — I10 Essential (primary) hypertension: Secondary | ICD-10-CM

## 2018-09-01 MED ORDER — NITROGLYCERIN 0.4 MG SL SUBL: 0.4000 mg | SUBLINGUAL_TABLET | SUBLINGUAL | 3 refills | Status: DC | PRN

## 2018-09-01 MED ORDER — AMLODIPINE BESYLATE 5 MG PO TABS: 5.0000 mg | ORAL_TABLET | Freq: Every day | ORAL | 3 refills | Status: DC

## 2018-09-01 MED ORDER — ATORVASTATIN CALCIUM 20 MG PO TABS: ORAL_TABLET | ORAL | 3 refills | Status: DC

## 2018-09-01 MED ORDER — LISINOPRIL-HYDROCHLOROTHIAZIDE 20-12.5 MG PO TABS: 1.0000 | ORAL_TABLET | Freq: Every day | ORAL | 3 refills | Status: DC

## 2018-09-01 MED ORDER — METOPROLOL SUCCINATE ER 50 MG PO TB24: ORAL_TABLET | ORAL | 3 refills | Status: DC

## 2018-09-01 NOTE — Progress Notes (Signed)
Virtual Visit via Telephone Note   This visit type was conducted due to national recommendations for restrictions regarding the COVID-19 Pandemic (e.g. social distancing) in an effort to limit this patient's exposure and mitigate transmission in our community.  Due to his co-morbid illnesses, this patient is at least at moderate risk for complications without adequate follow up.  This format is felt to be most appropriate for this patient at this time.  The patient did not have access to video technology/had technical difficulties with video requiring transitioning to audio format only (telephone).  All issues noted in this document were discussed and addressed.  No physical exam could be performed with this format.  Please refer to the patient's chart for his  consent to telehealth for [facility].   Date:  09/01/2018   ID:  Brent Brock, DOB 05-Jun-1945, MRN [id number]  Patient Location: Home Provider Location: Home  PCP:  Lemmie Evens, MD  Cardiologist:  Kate Sable, MD  Electrophysiologist:  None   Evaluation Performed:  Follow-Up Visit  Chief Complaint:  CAD  History of Present Illness:    Brent Brock is a 73 y.o. male with a history of a myocardial infarction several years ago requiring percutaneous coronary intervention. He also has a history of hypertension, hyperlipidemia, and a former history of tobacco abuse.  The patient denies any symptoms of chest pain, palpitations, shortness of breath, lightheadedness, dizziness, leg swelling, orthopnea, PND, and syncope.  Lipids reviewed from 04/08/18: TC 135, TG, 92, LDL 80, HDL 37.  The patient does not have symptoms concerning for COVID-19 infection (fever, chills, cough, or new shortness of breath).   Soc Hx: His deceased wife, Brent Brock, used to be my patient. 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 Has 3 grandchildren, a grandson who lives with him, and a granddaughter attends  [employer].  Past Medical History:  Diagnosis Date  . Anxiety   . ASCVD (arteriosclerotic cardiovascular disease)     PTCA of LAD in 03/1998; recath a few weeks later-no restenosis; apical MI in 04/1998; restenosis of the LAD-stent placed; rotational atherectomy of first diagonal  . BPH (benign prostatic hyperplasia)   . Chronic prostatitis   . Diverticulosis   . Elevated PSA   . Enlarged prostate   . GERD (gastroesophageal reflux disease)   . History of coronary angioplasty   . Hyperlipidemia   . Hypertension   . Nicotine dependence   . Past use of tobacco    40 pack years discontinued in 03/1998  . Seborrheic keratoses   . Spondylolysis    Past Surgical History:  Procedure Laterality Date  . COLONOSCOPY    . CORONARY STENT PLACEMENT     x 2  . POLYPECTOMY  1997   Polyps removed on vocal cords      Current Meds  Medication Sig  . amLODipine (NORVASC) 5 MG tablet Take 1 tablet (5 mg total) by mouth daily.  Marland Kitchen aspirin EC 81 MG tablet Take 81 mg by mouth daily.  Marland Kitchen atorvastatin (LIPITOR) 20 MG tablet TAKE ONE (1) TABLET BY MOUTH EVERY DAY  . esomeprazole (NEXIUM) 40 MG capsule Take 40 mg by mouth every other day.  . lisinopril-hydrochlorothiazide (ZESTORETIC) 20-12.5 MG tablet Take 1 tablet by mouth daily.  . metoprolol succinate (TOPROL-XL) 50 MG 24 hr tablet TAKE ONE (1) TABLET BY MOUTH EVERY DAY  . nitroGLYCERIN (NITROSTAT) 0.4 MG SL tablet Place 1 tablet (0.4 mg total) under the tongue every  5 (five) minutes as needed for chest pain.  . [DISCONTINUED] amLODipine (NORVASC) 5 MG tablet Take 1 tablet (5 mg total) by mouth daily.  . [DISCONTINUED] atorvastatin (LIPITOR) 20 MG tablet TAKE ONE (1) TABLET BY MOUTH EVERY DAY  . [DISCONTINUED] lisinopril-hydrochlorothiazide (PRINZIDE,ZESTORETIC) 20-12.5 MG tablet Take 1 tablet by mouth daily.  . [DISCONTINUED] metoprolol succinate (TOPROL-XL) 50 MG 24 hr tablet TAKE ONE (1) TABLET BY MOUTH EVERY DAY  . [DISCONTINUED]  nitroGLYCERIN (NITROSTAT) 0.4 MG SL tablet Place 1 tablet (0.4 mg total) under the tongue every 5 (five) minutes as needed for chest pain.     Allergies:   Penicillins and Sulfa antibiotics   Social History   Tobacco Use  . Smoking status: Former Smoker    Packs/day: 1.00    Years: 30.00    Pack years: 30.00    Types: Pipe    Start date: 03/26/1960    Last attempt to quit: 06/21/1998    Years since quitting: 20.2  . Smokeless tobacco: Never Used  Substance Use Topics  . Alcohol use: No    Alcohol/week: 0.0 standard drinks  . Drug use: No     Family Hx: The patient's family history includes Breast cancer in his daughter; CAD in his unknown relative; Heart attack in his father and mother; Heart disease in his father. There is no history of Colon cancer.  ROS:   Please see the history of present illness.     All other systems reviewed and are negative.   Prior CV studies:   The following studies were reviewed today:  NA  Labs/Other Tests and Data Reviewed:    EKG:  No ECG reviewed.  Recent Labs: No results found for requested labs within last 8760 hours.   Recent Lipid Panel Lab Results  Component Value Date/Time   CHOL 134 07/27/2013 02:01 PM   TRIG 80 07/27/2013 02:01 PM   TRIG 163 05/19/2009   HDL 41 07/27/2013 02:01 PM   CHOLHDL 3.3 07/27/2013 02:01 PM   LDLCALC 77 07/27/2013 02:01 PM    Wt Readings from Last 3 Encounters:  09/01/18 175 lb (79.4 kg)  08/12/17 180 lb 9.6 oz (81.9 kg)  04/17/17 184 lb (83.5 kg)     Objective:    Vital Signs:  BP 108/78   Pulse 92   Ht 5' 6.5" (1.689 m)   Wt 175 lb (79.4 kg)   BMI 27.82 kg/m    VITAL SIGNS:  reviewed  ASSESSMENT & PLAN:    1. [id number] stable. Continue therapy with aspirin, Lipitor, and metoprolol.  2. Essential hypertension:Controlled on amlodipine 5 mg, lisinopril 20 mg, hydrochlorothiazide 12.5 mg, and Toprol-XL 50 mg. No changes.  3. Hyperlipidemia:Lipids from January 2020  reviewed above. He is reluctant to increase dose of Lipitor. Continue Lipitor 20 mg.   COVID-19 Education: The signs and symptoms of COVID-19 were discussed with the patient and how to seek care for testing (follow up with PCP or arrange E-visit).  The importance of social distancing was discussed today.  Time:   Today, I have spent 15 minutes with the patient with telehealth technology discussing the above problems.     Medication Adjustments/Labs and Tests Ordered: Current medicines are reviewed at length with the patient today.  Concerns regarding medicines are outlined above.   Tests Ordered: No orders of the defined types were placed in this encounter.   Medication Changes: Meds ordered this encounter  Medications  . amLODipine (NORVASC) 5 MG tablet  Sig: Take 1 tablet (5 mg total) by mouth daily.    Dispense:  90 tablet    Refill:  3  . atorvastatin (LIPITOR) 20 MG tablet    Sig: TAKE ONE (1) TABLET BY MOUTH EVERY DAY    Dispense:  90 tablet    Refill:  3  . lisinopril-hydrochlorothiazide (ZESTORETIC) 20-12.5 MG tablet    Sig: Take 1 tablet by mouth daily.    Dispense:  90 tablet    Refill:  3  . metoprolol succinate (TOPROL-XL) 50 MG 24 hr tablet    Sig: TAKE ONE (1) TABLET BY MOUTH EVERY DAY    Dispense:  90 tablet    Refill:  3  . nitroGLYCERIN (NITROSTAT) 0.4 MG SL tablet    Sig: Place 1 tablet (0.4 mg total) under the tongue every 5 (five) minutes as needed for chest pain.    Dispense:  25 tablet    Refill:  3    Disposition:  Follow up in Nov/Dec 2020  Signed, Kate Sable, MD  09/01/2018 2:28 PM    [place]

## 2018-09-01 NOTE — Patient Instructions (Signed)
Medication Instructions: Your physician recommends that you continue on your current medications as directed. Please refer to the Current Medication list given to you today.   Labwork: none  Procedures/Testing: none  Follow-Up: December with Dr.Koneswaran  Any Additional Special Instructions Will Be Listed Below (If Applicable).     If you need a refill on your cardiac medications before your next appointment, please call your pharmacy.    I refilled all your medications for you.  Thank you for choosing [facility] !

## 2018-09-02 ENCOUNTER — Ambulatory Visit: Payer: Self-pay | Admitting: Cardiovascular Disease

## 2019-03-10 ENCOUNTER — Ambulatory Visit (INDEPENDENT_AMBULATORY_CARE_PROVIDER_SITE_OTHER): Payer: Medicare Other | Admitting: Cardiovascular Disease

## 2019-03-10 ENCOUNTER — Other Ambulatory Visit: Payer: Self-pay

## 2019-03-10 ENCOUNTER — Encounter: Payer: Self-pay | Admitting: Cardiovascular Disease

## 2019-03-10 VITALS — BP 114/82 | HR 74 | Temp 97.9°F | Ht 67.0 in | Wt 170.8 lb

## 2019-03-10 DIAGNOSIS — E785 Hyperlipidemia, unspecified: Secondary | ICD-10-CM | POA: Diagnosis not present

## 2019-03-10 DIAGNOSIS — I10 Essential (primary) hypertension: Secondary | ICD-10-CM | POA: Diagnosis not present

## 2019-03-10 DIAGNOSIS — I25118 Atherosclerotic heart disease of native coronary artery with other forms of angina pectoris: Secondary | ICD-10-CM

## 2019-03-10 NOTE — Progress Notes (Addendum)
SUBJECTIVE: Brent Brock is a 73 y.o. male with a history of a myocardial infarction several years ago requiring percutaneous coronary intervention. He also has a history of hypertension, hyperlipidemia, and a former history of tobacco abuse.  The patient denies any symptoms of chest pain, palpitations, shortness of breath, lightheadedness, dizziness, leg swelling, orthopnea, PND, and syncope.  Primary complaints relate to bilateral knee arthritis.  He also has some prostate issues and is going to see Dr. Alinda Money, his urologist.  I reviewed labs which he brought in dated 10/10/2018: Total cholesterol 124, HDL 39, triglycerides 86, LDL 68, BUN 18, creatinine 1.17, PSA 13.2, A1c 5.6%.  ECG performed in the office today which I ordered and personally interpreted demonstrates sinus bradycaria with no ischemic ST segment or T-wave abnormalities, nor any arrhythmias.    SocHx: His deceased wife, Barnetta Chapel, used to be my patient. 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 Has 3 grandchildren, a grandson who lives with him,anda granddaughter attends [employer].   Review of Systems: As per "subjective", otherwise negative.  Allergies  Allergen Reactions  . Penicillins Other (See Comments)    Thrush  . Sulfa Antibiotics Rash    Current Outpatient Medications  Medication Sig Dispense Refill  . amLODipine (NORVASC) 5 MG tablet Take 1 tablet (5 mg total) by mouth daily. 90 tablet 3  . aspirin EC 81 MG tablet Take 81 mg by mouth daily.    Marland Kitchen atorvastatin (LIPITOR) 20 MG tablet TAKE ONE (1) TABLET BY MOUTH EVERY DAY 90 tablet 3  . esomeprazole (NEXIUM) 40 MG capsule Take 40 mg by mouth every other day.    . lisinopril-hydrochlorothiazide (ZESTORETIC) 20-12.5 MG tablet Take 1 tablet by mouth daily. 90 tablet 3  . metoprolol succinate (TOPROL-XL) 50 MG 24 hr tablet TAKE ONE (1) TABLET BY MOUTH EVERY DAY 90 tablet 3  . nitroGLYCERIN (NITROSTAT) 0.4  MG SL tablet Place 1 tablet (0.4 mg total) under the tongue every 5 (five) minutes as needed for chest pain. 25 tablet 3   No current facility-administered medications for this visit.    Past Medical History:  Diagnosis Date  . Anxiety   . ASCVD (arteriosclerotic cardiovascular disease)     PTCA of LAD in 03/1998; recath a few weeks later-no restenosis; apical MI in 04/1998; restenosis of the LAD-stent placed; rotational atherectomy of first diagonal  . BPH (benign prostatic hyperplasia)   . Chronic prostatitis   . Diverticulosis   . Elevated PSA   . Enlarged prostate   . GERD (gastroesophageal reflux disease)   . History of coronary angioplasty   . Hyperlipidemia   . Hypertension   . Nicotine dependence   . Past use of tobacco    40 pack years discontinued in 03/1998  . Seborrheic keratoses   . Spondylolysis     Past Surgical History:  Procedure Laterality Date  . COLONOSCOPY    . CORONARY STENT PLACEMENT     x 2  . POLYPECTOMY  1997   Polyps removed on vocal cords     Social History   Socioeconomic History  . Marital status: Widowed    Spouse name: Not on file  . Number of children: Not on file  . Years of education: Not on file  . Highest education level: Not on file  Occupational History  . Occupation: Professor    Comment: [employer]  Tobacco Use  . Smoking status: Former Smoker  Packs/day: 1.00    Years: 30.00    Pack years: 30.00    Types: Pipe    Start date: 03/26/1960    Quit date: 06/21/1998    Years since quitting: 20.7  . Smokeless tobacco: Never Used  Substance and Sexual Activity  . Alcohol use: No    Alcohol/week: 0.0 standard drinks  . Drug use: No  . Sexual activity: Never  Other Topics Concern  . 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   Social Determinants of Health   Financial Resource Strain:   . Difficulty of Paying  Living Expenses: Not on file  Food Insecurity:   . Worried About Charity fundraiser in the Last Year: Not on file  . Ran Out of Food in the Last Year: Not on file  Transportation Needs:   . Lack of Transportation (Medical): Not on file  . Lack of Transportation (Non-Medical): Not on file  Physical Activity:   . Days of Exercise per Week: Not on file  . Minutes of Exercise per Session: Not on file  Stress:   . Feeling of Stress : Not on file  Social Connections:   . Frequency of Communication with Friends and Family: Not on file  . Frequency of Social Gatherings with Friends and Family: Not on file  . Attends Religious Services: Not on file  . Active Member of Clubs or Organizations: Not on file  . Attends Archivist Meetings: Not on file  . Marital Status: Not on file  Intimate Partner Violence:   . Fear of Current or Ex-Partner: Not on file  . Emotionally Abused: Not on file  . Physically Abused: Not on file  . Sexually Abused: Not on file     Vitals:   03/10/19 0807  BP: 114/82  Pulse: 74  Temp: 97.9 F (36.6 C)  SpO2: 98%  Weight: 170 lb 12.8 oz (77.5 kg)  Height: 5\' 7"  (1.702 m)    Wt Readings from Last 3 Encounters:  03/10/19 170 lb 12.8 oz (77.5 kg)  09/01/18 175 lb (79.4 kg)  08/12/17 180 lb 9.6 oz (81.9 kg)     PHYSICAL EXAM General: NAD HEENT: Normal. Neck: No JVD, no thyromegaly. Lungs: Clear to auscultation bilaterally with normal respiratory effort. CV: Regular rate and rhythm, normal S1/S2, no S3/S4, no murmur. No pretibial or periankle edema.  No carotid bruit.   Abdomen: Soft, nontender, no distention.  Neurologic: Alert and oriented.  Psych: Normal affect. Skin: Normal. Musculoskeletal: No gross deformities.      Labs: Lab Results  Component Value Date/Time   K 3.4 (L) 12/04/2013 11:45 AM   K 4.0 11/12/2011 12:00 AM   BUN 16 12/04/2013 11:45 AM   BUN 14 11/12/2011 12:00 AM   CREATININE 1.20 01/03/2017 03:06 PM    CREATININE 1.08 06/12/2012 12:20 PM   ALT 22 07/27/2013 02:01 PM   TSH 4.42 11/12/2011 12:00 AM   HGB 14.8 12/04/2013 11:45 AM     Lipids: Lab Results  Component Value Date/Time   LDLCALC 77 07/27/2013 02:01 PM   CHOL 134 07/27/2013 02:01 PM   TRIG 80 07/27/2013 02:01 PM   TRIG 163 05/19/2009 12:00 AM   HDL 41 07/27/2013 02:01 PM       ASSESSMENT AND PLAN:  1. [id number] stable. Continue therapy with aspirin, atorvastatin, and metoprolol. ECG is normal.  2. Essential hypertension:Controlled on amlodipine 5 mg, lisinopril 20 mg, hydrochlorothiazide 12.5 mg, and Toprol-XL 50 mg. No changes.  3. Hyperlipidemia: Lipids reviewed above and at goal.  Continue atorvastatin.   Disposition: Follow up May 2021 as per his request   Kate Sable, M.D., F.A.C.C.

## 2019-03-10 NOTE — Patient Instructions (Signed)
Medication Instructions:  Your physician recommends that you continue on your current medications as directed. Please refer to the Current Medication list given to you today.  *If you need a refill on your cardiac medications before your next appointment, please call your pharmacy*  Lab Work: None today If you have labs (blood work) drawn today and your tests are completely normal, you will receive your results only by: Marland Kitchen MyChart Message (if you have MyChart) OR . A paper copy in the mail If you have any lab test that is abnormal or we need to change your treatment, we will call you to review the results.  Testing/Procedures: None today  Follow-Up: At [facility], you and your health needs are our priority.  As part of our continuing mission to provide you with exceptional heart care, we have created designated Provider Care Teams.  These Care Teams include your primary Cardiologist (physician) and Advanced Practice Providers (APPs -  Physician Assistants and Nurse Practitioners) who all work together to provide you with the care you need, when you need it.  Your next appointment:   6 month(s)  The format for your next appointment:   In Person  Provider:   Kate Sable, MD  Other Instructions None       Thank you for choosing [facility] !

## 2019-03-13 ENCOUNTER — Ambulatory Visit: Payer: Medicare Other | Admitting: Gastroenterology

## 2019-03-13 ENCOUNTER — Encounter: Payer: Self-pay | Admitting: Gastroenterology

## 2019-03-13 VITALS — BP 122/90 | HR 76 | Temp 98.3°F | Ht 64.5 in | Wt 173.1 lb

## 2019-03-13 DIAGNOSIS — R1084 Generalized abdominal pain: Secondary | ICD-10-CM | POA: Diagnosis not present

## 2019-03-13 NOTE — Progress Notes (Signed)
[facility] Consult Note:  History: Brent Brock 03/13/2019  Referring provider: Lemmie Evens, MD  Reason for consult/chief complaint: Abdominal Pain (periumbilical discomfort, started a couple of months ago with 3 days of diarrhea none since) and Bloated   Subjective  HPI: See 03/2017 office note.  [name] had ongoing right upper quadrant pain and bloating when he saw me.  He had previously declined colonoscopy because his wife died from complications of that procedure.  I recommended he have a colonoscopy with me, he wanted some time to consider it, but I have not seen him since that visit.  He returns today because a few months ago he developed some periumbilic abdominal discomfort that is a dull fairly constant feeling at this point.  When it first happened, he had 3 days of severe diarrhea which resolved and has not recurred.  At this point does not sound like it is much of pain is just a general uneasy feeling like something is wrong.  Brent Brock also feels as if there are "knots" in various locations in the abdomen.  He feels bloated much of the time, but bowel habits are regular and he denies rectal bleeding.  He has become increasingly concerned about the possibility of cancer, especially because both his wife and a daughter battled cancer before they eventually passed.   ROS:  Review of Systems  Constitutional: Negative for appetite change and unexpected weight change.  HENT: Negative for mouth sores and voice change.   Eyes: Negative for pain and redness.  Respiratory: Negative for cough and shortness of breath.   Cardiovascular: Negative for chest pain and palpitations.  Genitourinary: Negative for dysuria and hematuria.  Musculoskeletal: Negative for arthralgias and myalgias.  Skin: Negative for pallor and rash.  Neurological: Negative for weakness and headaches.  Hematological: Negative for adenopathy.     Past Medical History: Past Medical  History:  Diagnosis Date  . Anxiety   . ASCVD (arteriosclerotic cardiovascular disease)     PTCA of LAD in 03/1998; recath a few weeks later-no restenosis; apical MI in 04/1998; restenosis of the LAD-stent placed; rotational atherectomy of first diagonal  . BPH (benign prostatic hyperplasia)   . Chronic prostatitis   . Diverticulosis   . Elevated PSA   . Enlarged prostate   . GERD (gastroesophageal reflux disease)   . History of coronary angioplasty   . Hyperlipidemia   . Hypertension   . Nicotine dependence   . Past use of tobacco    40 pack years discontinued in 03/1998  . Seborrheic keratoses   . Spondylolysis      Past Surgical History: Past Surgical History:  Procedure Laterality Date  . COLONOSCOPY    . CORONARY STENT PLACEMENT     x 2  . POLYPECTOMY  1997   Polyps removed on vocal cords      Family History: Family History  Problem Relation Age of Onset  . Heart attack Mother   . Heart attack Father   . Heart disease Father   . Breast cancer Daughter        age 68   . CAD Other   . Colon cancer Neg Hx     Social History: Social History   Socioeconomic History  . Marital status: Widowed    Spouse name: Not on file  . Number of children: Not on file  . Years of education: Not on file  . Highest education level: Not on file  Occupational History  . Occupation:  Professor    Comment: [employer]  Tobacco Use  . Smoking status: Former Smoker    Packs/day: 1.00    Years: 30.00    Pack years: 30.00    Types: Pipe    Start date: 03/26/1960    Quit date: 06/21/1998    Years since quitting: 20.7  . Smokeless tobacco: Never Used  Substance and Sexual Activity  . Alcohol use: No    Alcohol/week: 0.0 standard drinks  . Drug use: No  . Sexual activity: Never  Other Topics Concern  . 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 Difficulty of Paying Living Expenses: Not on file  Food Insecurity:   . Worried About Charity fundraiser in the Last Year: Not on file  . Ran Out of Food in the Last Year: Not on file  Transportation Needs:   . Lack of Transportation (Medical): Not on file  . Lack of Transportation (Non-Medical): Not on file  Physical Activity:   . Days of Exercise per Week: Not on file  . Minutes of Exercise per Session: Not on file  Stress:   . Feeling of Stress : Not on file  Social Connections:   . Frequency of Communication with Friends and Family: Not on file  . Frequency of Social Gatherings with Friends and Family: Not on file  . Attends Religious Services: Not on file  . Active Member of Clubs or Organizations: Not on file  . Attends Archivist Meetings: Not on file  . Marital Status: Not on file    Allergies: Allergies  Allergen Reactions  . [place] Venom [Bee Venom] Swelling  . Penicillins Other (See Comments)    Thrush  . Sulfa Antibiotics Rash    Outpatient Meds: Current Outpatient Medications  Medication Sig Dispense Refill  . amLODipine (NORVASC) 5 MG tablet Take 1 tablet (5 mg total) by mouth daily. 90 tablet 3  . aspirin EC 81 MG tablet Take 81 mg by mouth daily.    Marland Kitchen atorvastatin (LIPITOR) 20 MG tablet TAKE ONE (1) TABLET BY MOUTH EVERY DAY 90 tablet 3  . esomeprazole (NEXIUM) 40 MG capsule Take 40 mg by mouth every other day.    . lisinopril-hydrochlorothiazide (ZESTORETIC) 20-12.5 MG tablet Take 1 tablet by mouth daily. 90 tablet 3  . metoprolol succinate (TOPROL-XL) 50 MG 24 hr tablet TAKE ONE (1) TABLET BY MOUTH EVERY DAY 90 tablet 3  . tamsulosin (FLOMAX) 0.4 MG CAPS capsule Take 0.4 mg by mouth daily.    . nitroGLYCERIN (NITROSTAT) 0.4 MG SL tablet Place 1 tablet (0.4 mg total) under the tongue every 5 (five) minutes as needed for chest pain. (Patient not taking: Reported on 03/13/2019) 25  tablet 3   No current facility-administered medications for this visit.      ___________________________________________________________________ Objective   Exam:  BP 122/90 (BP Location: Left Arm, Patient Position: Sitting, Cuff Size: Normal)   Pulse 76   Temp 98.3 F (36.8 C)   Ht 5' 4.5" (1.638 m) Comment: height measured without shoes  Wt 173 lb 2 oz (78.5 kg)   BMI 29.26 kg/m    General: He is well-appearing  Eyes: sclera anicteric, no redness  ENT: oral mucosa moist without lesions, no cervical or supraclavicular lymphadenopathy  CV:  RRR without murmur, S1/S2, no JVD, no peripheral edema  Resp: clear to auscultation bilaterally, normal RR and effort noted  GI: soft, mild scattered tenderness, with active bowel sounds. No guarding or palpable organomegaly noted, somewhat limited by body habitus.  Protuberant abdomen  Skin; warm and dry, no rash or jaundice noted  Neuro: awake, alert and oriented x 3. Normal gross motor function and fluent speech  Labs:  No recent lab testing  Assessment: Encounter Diagnosis  Name Primary?  . Generalized abdominal pain Yes    Somewhat vague abdominal discomfort, difficulty characterize.  No other localizing GI symptoms.  He has concerns about malignancy.  Plan:  CT scan abdomen and pelvis with oral and IV contrast. He believes that he would only consider endoscopic testing if there is a definite and worrisome finding on the CT scan that would indicate the need for that.  Thank you for the courtesy of this consult.  Please call me with any questions or concerns.  Nelida Meuse III  CC: Referring provider noted above

## 2019-03-13 NOTE — Patient Instructions (Signed)
If you are age 73 or older, your body mass index should be between 23-30. Your Body mass index is 29.26 kg/m. If this is out of the aforementioned range listed, please consider follow up with your Primary Care Provider.  If you are age 30 or younger, your body mass index should be between 19-25. Your Body mass index is 29.26 kg/m. If this is out of the aformentioned range listed, please consider follow up with your Primary Care Provider.   You have been scheduled for a CT scan of the abdomen and pelvis at [facility] ([address]---this is in the same building as [employer]).   You are scheduled on 03-18-2019 at 3pm. You should arrive 15 minutes prior to your appointment time for registration. Please follow the written instructions below on the day of your exam:  WARNING: IF YOU ARE ALLERGIC TO IODINE/X-RAY DYE, PLEASE NOTIFY RADIOLOGY IMMEDIATELY AT [phone]! YOU WILL BE GIVEN A 13 HOUR PREMEDICATION PREP.  1) Do not eat or drink anything after 11am (4 hours prior to your test) 2) You have been given 2 bottles of oral contrast to drink. The solution may taste better if refrigerated, but do NOT add ice or any other liquid to this solution. Shake well before drinking.    Drink 1 bottle of contrast @ 1pm (2 hours prior to your exam)  Drink 1 bottle of contrast @ 2pm (1 hour prior to your exam)  You may take any medications as prescribed with a small amount of water, if necessary. If you take any of the following medications: METFORMIN, GLUCOPHAGE, GLUCOVANCE, AVANDAMET, RIOMET, FORTAMET, [place] MET, JANUMET, GLUMETZA or METAGLIP, you MAY be asked to HOLD this medication 48 hours AFTER the exam.  The purpose of you drinking the oral contrast is to aid in the visualization of your intestinal tract. The contrast solution may cause some diarrhea. Depending on your individual set of symptoms, you may also receive an intravenous injection of x-ray contrast/dye. Plan on  being at [facility] for 30 minutes or longer, depending on the type of exam you are having performed.  This test typically takes 30-45 minutes to complete.  If you have any questions regarding your exam or if you need to reschedule, you may call the CT department at [phone] between the hours of 8:00 am and 5:00 pm, Monday-Friday.   Your provider has requested that you go to the basement level for lab work before leaving today. Press "B" on the elevator. The lab is located at the first door on the left as you exit the elevator.  ________________________________________________________________________  It was a pleasure to see you today!  Dr. Loletha Carrow

## 2019-03-17 ENCOUNTER — Other Ambulatory Visit: Payer: Self-pay | Admitting: Gastroenterology

## 2019-03-18 ENCOUNTER — Ambulatory Visit (INDEPENDENT_AMBULATORY_CARE_PROVIDER_SITE_OTHER)
Admission: RE | Admit: 2019-03-18 | Discharge: 2019-03-18 | Disposition: A | Discharge status: Home / Self Care | Payer: Medicare Other | Source: Ambulatory Visit | Attending: Gastroenterology | Admitting: Gastroenterology

## 2019-03-18 ENCOUNTER — Other Ambulatory Visit: Payer: Self-pay

## 2019-03-18 DIAGNOSIS — R1084 Generalized abdominal pain: Secondary | ICD-10-CM | POA: Diagnosis not present

## 2019-03-18 LAB — BUN+CREAT
BUN/Creatinine Ratio: 18 (ref 10–24)
BUN: 21 mg/dL (ref 8–27)
Creatinine, Ser: 1.18 mg/dL (ref 0.76–1.27)
GFR calc Af Amer: 70 mL/min/{1.73_m2} (ref >59)
GFR calc non Af Amer: 61 mL/min/{1.73_m2} (ref >59)

## 2019-03-18 MED ORDER — IOHEXOL 300 MG/ML  SOLN
100.0000 mL | Freq: Once | INTRAMUSCULAR | Status: AC | PRN
  Administered 2019-03-18: 100 mL via INTRAVENOUS

## 2019-07-27 ENCOUNTER — Encounter: Payer: Self-pay | Admitting: Cardiovascular Disease

## 2019-07-27 ENCOUNTER — Other Ambulatory Visit: Payer: Self-pay

## 2019-07-27 ENCOUNTER — Ambulatory Visit: Payer: Medicare PPO | Admitting: Cardiovascular Disease

## 2019-07-27 VITALS — BP 142/80 | HR 86 | Ht 67.0 in | Wt 175.0 lb

## 2019-07-27 DIAGNOSIS — E785 Hyperlipidemia, unspecified: Secondary | ICD-10-CM

## 2019-07-27 DIAGNOSIS — I25118 Atherosclerotic heart disease of native coronary artery with other forms of angina pectoris: Secondary | ICD-10-CM | POA: Diagnosis not present

## 2019-07-27 DIAGNOSIS — I10 Essential (primary) hypertension: Secondary | ICD-10-CM | POA: Diagnosis not present

## 2019-07-27 MED ORDER — LISINOPRIL-HYDROCHLOROTHIAZIDE 20-12.5 MG PO TABS: 1.0000 | ORAL_TABLET | Freq: Every day | ORAL | 3 refills | Status: DC

## 2019-07-27 MED ORDER — METOPROLOL SUCCINATE ER 50 MG PO TB24: ORAL_TABLET | ORAL | 3 refills | Status: DC

## 2019-07-27 MED ORDER — AMLODIPINE BESYLATE 5 MG PO TABS: 5.0000 mg | ORAL_TABLET | Freq: Every day | ORAL | 3 refills | Status: DC

## 2019-07-27 MED ORDER — NITROGLYCERIN 0.4 MG SL SUBL: 0.4000 mg | SUBLINGUAL_TABLET | SUBLINGUAL | 3 refills | Status: DC | PRN

## 2019-07-27 MED ORDER — ATORVASTATIN CALCIUM 20 MG PO TABS: ORAL_TABLET | ORAL | 3 refills | Status: DC

## 2019-07-27 NOTE — Patient Instructions (Signed)
Medication Instructions: Your physician recommends that you continue on your current medications as directed. Please refer to the Current Medication list given to you today.   Labwork: None today  Procedures/Testing: None today  Follow-Up: 6 months office visit with Dr.Koneswaran  Any Additional Special Instructions Will Be Listed Below (If Applicable).     If you need a refill on your cardiac medications before your next appointment, please call your pharmacy.   

## 2019-07-27 NOTE — Progress Notes (Signed)
SUBJECTIVE: Brent Brock is a 74 y.o. male with a history of a myocardial infarction several years ago requiring percutaneous coronary intervention. He also has a history of hypertension, hyperlipidemia, and a former history of tobacco abuse.  The patient denies any symptoms of chest pain, palpitations, shortness of breath, lightheadedness, dizziness, leg swelling, orthopnea, PND, and syncope.  I reviewed labs dated 10/10/2018: Total cholesterol 124, HDL 39, triglycerides 86, LDL 68, BUN 18, creatinine 1.17, PSA 13.2, A1c 5.6%.  His primary complaints relate to bilateral arthritis of the knees.  He has hesitated to undergo knee replacement surgery which he needs.    SocHx: His deceased wife, Barnetta Chapel, used to be my patient. 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 Has 3 grandchildren, a grandson who lives with him,anda granddaughter attends [employer].   Review of Systems: As per "subjective", otherwise negative.  Allergies  Allergen Reactions  . Yellow Jacket Venom [Bee Venom] Swelling  . Penicillins Other (See Comments)    Thrush  . Sulfa Antibiotics Rash    Current Outpatient Medications  Medication Sig Dispense Refill  . amLODipine (NORVASC) 5 MG tablet Take 1 tablet (5 mg total) by mouth daily. 90 tablet 3  . aspirin EC 81 MG tablet Take 81 mg by mouth daily.    Marland Kitchen atorvastatin (LIPITOR) 20 MG tablet TAKE ONE (1) TABLET BY MOUTH EVERY DAY 90 tablet 3  . esomeprazole (NEXIUM) 40 MG capsule Take 40 mg by mouth every other day.    . lisinopril-hydrochlorothiazide (ZESTORETIC) 20-12.5 MG tablet Take 1 tablet by mouth daily. 90 tablet 3  . metoprolol succinate (TOPROL-XL) 50 MG 24 hr tablet TAKE ONE (1) TABLET BY MOUTH EVERY DAY 90 tablet 3  . nitroGLYCERIN (NITROSTAT) 0.4 MG SL tablet Place 1 tablet (0.4 mg total) under the tongue every 5 (five) minutes as needed for chest pain. 25 tablet 3   No current facility-administered  medications for this visit.    Past Medical History:  Diagnosis Date  . Anxiety   . ASCVD (arteriosclerotic cardiovascular disease)     PTCA of LAD in 03/1998; recath a few weeks later-no restenosis; apical MI in 04/1998; restenosis of the LAD-stent placed; rotational atherectomy of first diagonal  . BPH (benign prostatic hyperplasia)   . Chronic prostatitis   . Diverticulosis   . Elevated PSA   . Enlarged prostate   . GERD (gastroesophageal reflux disease)   . History of coronary angioplasty   . Hyperlipidemia   . Hypertension   . Nicotine dependence   . Past use of tobacco    40 pack years discontinued in 03/1998  . Seborrheic keratoses   . Spondylolysis     Past Surgical History:  Procedure Laterality Date  . COLONOSCOPY    . CORONARY STENT PLACEMENT     x 2  . POLYPECTOMY  1997   Polyps removed on vocal cords     Social History   Socioeconomic History  . Marital status: Widowed    Spouse name: Not on file  . Number of children: Not on file  . Years of education: Not on file  . Highest education level: Not on file  Occupational History  . Occupation: Professor    Comment: [employer]  Tobacco Use  . Smoking status: Former Smoker    Packs/day: 1.00    Years: 30.00    Pack years: 30.00    Types: Pipe    Start date: 03/26/1960  Quit date: 06/21/1998    Years since quitting: 21.1  . Smokeless tobacco: Never Used  Substance and Sexual Activity  . Alcohol use: No    Alcohol/week: 0.0 standard drinks  . Drug use: No  . Sexual activity: Never  Other Topics Concern  . Not on file  Social History Narrative   Lives w/ Wife   Lost only daughter from metastatic breast cancer    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[employer]   Social Determinants of Health   Financial Resource Strain:   . Difficulty of Paying Living Expenses:   Food Insecurity:   . Worried About Charity fundraiser in the Last Year:   . Arboriculturist in the Last Year:   Transportation Needs:    . Film/video editor (Medical):   Marland Kitchen Lack of Transportation (Non-Medical):   Physical Activity:   . Days of Exercise per Week:   . Minutes of Exercise per Session:   Stress:   . Feeling of Stress :   Social Connections:   . Frequency of Communication with Friends and Family:   . Frequency of Social Gatherings with Friends and Family:   . Attends Religious Services:   . Active Member of Clubs or Organizations:   . Attends Archivist Meetings:   Marland Kitchen Marital Status:   Intimate Partner Violence:   . Fear of Current or Ex-Partner:   . Emotionally Abused:   Marland Kitchen Physically Abused:   . Sexually Abused:      Vitals:   07/27/19 0839  BP: (!) 142/80  Pulse: 86  SpO2: 96%  Weight: 175 lb (79.4 kg)  Height: 5\' 7"  (1.702 m)    Wt Readings from Last 3 Encounters:  07/27/19 175 lb (79.4 kg)  03/13/19 173 lb 2 oz (78.5 kg)  03/10/19 170 lb 12.8 oz (77.5 kg)     PHYSICAL EXAM General: NAD HEENT: Normal. Neck: No JVD, no thyromegaly. Lungs: Clear to auscultation bilaterally with normal respiratory effort. CV: Regular rate and rhythm, normal S1/S2, no S3/S4, no murmur. No pretibial or periankle edema.  No carotid bruit.   Abdomen: Soft, nontender, no distention.  Neurologic: Alert and oriented.  Psych: Normal affect. Skin: Normal. Musculoskeletal: No gross deformities.      Labs: Lab Results  Component Value Date/Time   K 3.4 (L) 12/04/2013 11:45 AM   K 4.0 11/12/2011 12:00 AM   BUN 21 03/17/2019 03:17 PM   CREATININE 1.18 03/17/2019 03:17 PM   CREATININE 1.08 06/12/2012 12:20 PM   ALT 22 07/27/2013 02:01 PM   TSH 4.42 11/12/2011 12:00 AM   HGB 14.8 12/04/2013 11:45 AM     Lipids: Lab Results  Component Value Date/Time   LDLCALC 77 07/27/2013 02:01 PM   CHOL 134 07/27/2013 02:01 PM   TRIG 80 07/27/2013 02:01 PM   TRIG 163 05/19/2009 12:00 AM   HDL 41 07/27/2013 02:01 PM       ASSESSMENT AND PLAN:  1. [id number] stable. Continue therapy  with aspirin, atorvastatin, and metoprolol.   2. Essential hypertension:BP is mildly elevated today but normal at last visit.  Blood pressures at home range in the 110-120/60-70 range.  No changes to therapy.  3. Hyperlipidemia: Lipids reviewed above and at goal.  Continue atorvastatin.   Disposition: Follow up 6 months   Kate Sable, M.D., F.A.C.C.

## 2019-08-19 ENCOUNTER — Other Ambulatory Visit: Payer: Self-pay | Admitting: Urology

## 2019-08-19 DIAGNOSIS — R972 Elevated prostate specific antigen [PSA]: Secondary | ICD-10-CM

## 2019-09-10 ENCOUNTER — Ambulatory Visit (INDEPENDENT_AMBULATORY_CARE_PROVIDER_SITE_OTHER): Payer: Medicare PPO | Admitting: Orthopaedic Surgery

## 2019-09-10 ENCOUNTER — Ambulatory Visit: Payer: Self-pay

## 2019-09-10 ENCOUNTER — Encounter: Payer: Self-pay | Admitting: Orthopaedic Surgery

## 2019-09-10 ENCOUNTER — Other Ambulatory Visit: Payer: Self-pay

## 2019-09-10 VITALS — BP 96/62 | HR 71 | Ht 67.0 in | Wt 175.0 lb

## 2019-09-10 DIAGNOSIS — G8929 Other chronic pain: Secondary | ICD-10-CM

## 2019-09-10 DIAGNOSIS — M545 Low back pain: Secondary | ICD-10-CM | POA: Diagnosis not present

## 2019-09-10 NOTE — Progress Notes (Signed)
Office Visit Note   Patient: Brent Brock           Date of Birth: [birth year]           MRN: [id number] Visit Date: 09/10/2019              Requested by: Lemmie Evens, MD [address]  [place] [address] PCP: Lemmie Evens, MD   Assessment & Plan: Visit Diagnoses:  1. Chronic bilateral low back pain, unspecified whether sciatica present     Plan: We reviewed previous lumbar plain radiographs available on PACS as well as CT scan done for abdominal pain in December 2020.  Further imaging at this point is not indicated.  We discussed breaking up turning and twisting activities such as using the weedeater to do about half the yard wait a day or so later do the other half.  If he develops increasing radicular symptoms he will return.  Follow-Up Instructions: No follow-ups on file.   Orders:  Orders Placed This Encounter  Procedures  . XR Lumbar Spine 2-3 Views   No orders of the defined types were placed in this encounter.     Procedures: No procedures performed   Clinical Data: No additional findings.   Subjective: Chief Complaint  Patient presents with  . Lower Back - Pain    HPI 74 year old male seen with low back pain with turning and twisting.  He states when he uses a weed eater if he does a lot of activity has increased discomfort.  Pain radiates from his back into his buttocks.  No numbness or tingling in his feet no weakness no problems with steps.  He does have bilateral knee osteoarthritis has been wearing knee braces and sees Dr. Wynelle Link and is trying to put off knee surgery as long as he can.  Does have heart disease previous MI avoids anti-inflammatories.  If he is not turning or twisting states his back really does not bother him.  Patient had previous CT abdomen and pelvis which showed some facet arthropathy with normal alignment and maintained disc space height.  There was some spurring at L4-5.  Review of Systems positive for cardiac  disease no current angina no shortness of breath no dyspnea past smoker.  Positive for history of acid reflux not currently symptomatic and bilateral knee osteoarthritis.  Otherwise negative all other systems as it pertains to HPI.   Objective: Vital Signs: BP 96/62   Pulse 71   Ht 5\' 7"  (1.702 m)   Wt 175 lb (79.4 kg)   BMI 27.41 kg/m   Physical Exam Constitutional:      Appearance: He is well-developed.  HENT:     Head: Normocephalic and atraumatic.  Eyes:     Pupils: Pupils are equal, round, and reactive to light.  Neck:     Thyroid: No thyromegaly.     Trachea: No tracheal deviation.  Cardiovascular:     Rate and Rhythm: Normal rate.  Pulmonary:     Effort: Pulmonary effort is normal.     Breath sounds: No wheezing.  Abdominal:     General: Bowel sounds are normal.     Palpations: Abdomen is soft.  Skin:    General: Skin is warm and dry.     Capillary Refill: Capillary refill takes less than 2 seconds.  Neurological:     Mental Status: He is alert and oriented to person, place, and time.  Psychiatric:        Behavior:  Behavior normal.        Thought Content: Thought content normal.        Judgment: Judgment normal.     Ortho Exam patient has negative logroll the hips reflexes are 2+ and symmetrical mild varus of the right and left knee.  Anterior tib EHL is strong with minimal sciatic notch tenderness no trochanteric bursal tenderness.  Patient has normal heel and toe walking.  Specialty Comments:  No specialty comments available.  Imaging: No results found.   PMFS History: Patient Active Problem List   Diagnosis Date Noted  . Tobacco abuse, in remission   . Hyperlipidemia 06/02/2009  . Anxiety 06/02/2009  . Hypertension 06/02/2009  . Arteriosclerotic cardiovascular disease (ASCVD) 06/02/2009  . Gastroesophageal reflux 06/02/2009   Past Medical History:  Diagnosis Date  . Anxiety   . ASCVD (arteriosclerotic cardiovascular disease)     PTCA of LAD in  03/1998; recath a few weeks later-no restenosis; apical MI in 04/1998; restenosis of the LAD-stent placed; rotational atherectomy of first diagonal  . BPH (benign prostatic hyperplasia)   . Chronic prostatitis   . Diverticulosis   . Elevated PSA   . Enlarged prostate   . GERD (gastroesophageal reflux disease)   . History of coronary angioplasty   . Hyperlipidemia   . Hypertension   . Nicotine dependence   . Past use of tobacco    40 pack years discontinued in 03/1998  . Seborrheic keratoses   . Spondylolysis     Family History  Problem Relation Age of Onset  . Heart attack Mother   . Heart attack Father   . Heart disease Father   . Breast cancer Daughter        age 68   . CAD Other   . Colon cancer Neg Hx     Past Surgical History:  Procedure Laterality Date  . COLONOSCOPY    . CORONARY STENT PLACEMENT     x 2  . POLYPECTOMY  1997   Polyps removed on vocal cords    Social History   Occupational History  . Occupation: Professor    Comment: [employer]  Tobacco Use  . Smoking status: Former Smoker    Packs/day: 1.00    Years: 30.00    Pack years: 30.00    Types: Pipe    Start date: 03/26/1960    Quit date: 06/21/1998    Years since quitting: 21.2  . Smokeless tobacco: Never Used  Vaping Use  . Vaping Use: Never used  Substance and Sexual Activity  . Alcohol use: No    Alcohol/week: 0.0 standard drinks  . Drug use: No  . Sexual activity: Never

## 2019-10-09 ENCOUNTER — Other Ambulatory Visit: Payer: Self-pay

## 2019-10-09 ENCOUNTER — Ambulatory Visit
Admission: RE | Admit: 2019-10-09 | Discharge: 2019-10-09 | Disposition: A | Discharge status: Home / Self Care | Payer: Medicare PPO | Source: Ambulatory Visit | Attending: Urology | Admitting: Urology

## 2019-10-09 DIAGNOSIS — R972 Elevated prostate specific antigen [PSA]: Secondary | ICD-10-CM

## 2019-10-09 MED ORDER — GADOBENATE DIMEGLUMINE 529 MG/ML IV SOLN
20.0000 mL | Freq: Once | INTRAVENOUS | Status: AC | PRN
  Administered 2019-10-09: 20 mL via INTRAVENOUS

## 2020-01-12 ENCOUNTER — Other Ambulatory Visit: Payer: Self-pay

## 2020-01-12 ENCOUNTER — Ambulatory Visit (HOSPITAL_COMMUNITY)
Admission: RE | Admit: 2020-01-12 | Discharge: 2020-01-12 | Disposition: A | Discharge status: Home / Self Care | Payer: Medicare PPO | Source: Ambulatory Visit | Attending: Family Medicine | Admitting: Family Medicine

## 2020-01-12 ENCOUNTER — Other Ambulatory Visit (HOSPITAL_COMMUNITY): Payer: Self-pay | Admitting: Family Medicine

## 2020-01-12 DIAGNOSIS — M545 Low back pain, unspecified: Secondary | ICD-10-CM | POA: Diagnosis present

## 2020-01-15 ENCOUNTER — Other Ambulatory Visit: Payer: Self-pay | Admitting: Family Medicine

## 2020-01-15 ENCOUNTER — Other Ambulatory Visit (HOSPITAL_COMMUNITY): Payer: Self-pay | Admitting: Family Medicine

## 2020-01-15 DIAGNOSIS — M545 Low back pain, unspecified: Secondary | ICD-10-CM

## 2020-01-26 ENCOUNTER — Other Ambulatory Visit: Payer: Self-pay

## 2020-01-26 ENCOUNTER — Ambulatory Visit (HOSPITAL_COMMUNITY)
Admission: RE | Admit: 2020-01-26 | Discharge: 2020-01-26 | Disposition: A | Discharge status: Home / Self Care | Payer: Medicare PPO | Source: Ambulatory Visit | Attending: Family Medicine | Admitting: Family Medicine

## 2020-01-26 DIAGNOSIS — M545 Low back pain, unspecified: Secondary | ICD-10-CM | POA: Diagnosis present

## 2020-02-01 ENCOUNTER — Ambulatory Visit: Payer: Self-pay | Admitting: Cardiovascular Disease

## 2020-02-15 ENCOUNTER — Encounter: Payer: Self-pay | Admitting: Internal Medicine

## 2020-02-15 ENCOUNTER — Telehealth: Payer: Self-pay | Admitting: Internal Medicine

## 2020-02-15 ENCOUNTER — Ambulatory Visit: Payer: Medicare PPO | Admitting: Internal Medicine

## 2020-02-15 ENCOUNTER — Other Ambulatory Visit: Payer: Self-pay

## 2020-02-15 ENCOUNTER — Telehealth: Payer: Self-pay

## 2020-02-15 VITALS — BP 130/84 | HR 78 | Ht 66.5 in | Wt 179.0 lb

## 2020-02-15 DIAGNOSIS — M549 Dorsalgia, unspecified: Secondary | ICD-10-CM

## 2020-02-15 DIAGNOSIS — I10 Essential (primary) hypertension: Secondary | ICD-10-CM

## 2020-02-15 MED ORDER — CELECOXIB 100 MG PO CAPS: 100.0000 mg | ORAL_CAPSULE | Freq: Two times a day (BID) | ORAL | 1 refills | Status: DC

## 2020-02-15 NOTE — Progress Notes (Signed)
SUBJECTIVE: Brent Brock is a 74 y.o. male with a history of a myocardial infarction in 2000 requiring percutaneous coronary intervention ( [name]). He also has a history of hypertension, hyperlipidemia, mild CV dz (USN in 2018) and a former history of tobacco abuse.  He was last in cardiology clinic in May 2021 Since seen he has done well from a cardiac standpoint  Breathing is OK  No CP   No dizziness   He brings in a list of BP readings from home  Range 87 to 120/  He says he feels good when low The pt's biggest complaint is ba   .   Review of Systems: As per "subjective", otherwise negative.  Allergies  Allergen Reactions   Yellow Jacket Venom [Bee Venom] Swelling   Penicillins Other (See Comments)    Thrush   Sulfa Antibiotics Rash    Current Outpatient Medications  Medication Sig Dispense Refill   amLODipine (NORVASC) 5 MG tablet Take 1 tablet (5 mg total) by mouth daily. 90 tablet 3   aspirin EC 81 MG tablet Take 81 mg by mouth daily.     atorvastatin (LIPITOR) 20 MG tablet TAKE ONE (1) TABLET BY MOUTH EVERY DAY 90 tablet 3   esomeprazole (NEXIUM) 40 MG capsule Take 40 mg by mouth every other day.     finasteride (PROSCAR) 5 MG tablet Take 5 mg by mouth daily.     lisinopril-hydrochlorothiazide (ZESTORETIC) 20-12.5 MG tablet Take 1 tablet by mouth daily. 90 tablet 3   metoprolol succinate (TOPROL-XL) 50 MG 24 hr tablet TAKE ONE (1) TABLET BY MOUTH EVERY DAY 90 tablet 3   nitroGLYCERIN (NITROSTAT) 0.4 MG SL tablet Place 1 tablet (0.4 mg total) under the tongue every 5 (five) minutes as needed for chest pain. 25 tablet 3   No current facility-administered medications for this visit.    Past Medical History:  Diagnosis Date   Anxiety    ASCVD (arteriosclerotic cardiovascular disease)     PTCA of LAD in 03/1998; recath a few weeks later-no restenosis; apical MI in 04/1998; restenosis of the LAD-stent placed; rotational atherectomy of first diagonal    BPH (benign prostatic hyperplasia)    Chronic prostatitis    Diverticulosis    Elevated PSA    Enlarged prostate    GERD (gastroesophageal reflux disease)    History of coronary angioplasty    Hyperlipidemia    Hypertension    Nicotine dependence    Past use of tobacco    40 pack years discontinued in 03/1998   Seborrheic keratoses    Spondylolysis     Past Surgical History:  Procedure Laterality Date   COLONOSCOPY     CORONARY STENT PLACEMENT     x 2   POLYPECTOMY  1997   Polyps removed on vocal cords     Social History   Socioeconomic History   Marital status: Widowed    Spouse name: Not on file   Number of children: Not on file   Years of education: Not on file   Highest education level: Not on file  Occupational History   Occupation: Professor    Comment: [employer]  Tobacco Use   Smoking status: Former Smoker    Packs/day: 1.00    Years: 30.00    Pack years: 30.00    Types: Pipe    Start date: 03/26/1960    Quit date: 06/21/1998    Years since quitting: 21.6   Smokeless  tobacco: Never Used  Vaping Use   Vaping Use: Never used  Substance and Sexual Activity   Alcohol use: No    Alcohol/week: 0.0 standard drinks   Drug use: No   Sexual activity: Never  Other Topics Concern   Not on file  Social History Narrative   Lives w/ Wife   Lost only daughter from metastatic breast cancer    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[employer]   Social Determinants of Health   Financial Resource Strain:    Difficulty of Paying Living Expenses: Not on file  Food Insecurity:    Worried About Charity fundraiser in the Last Year: Not on file   [employer] of Food in the Last Year: Not on file  Transportation Needs:    Lack of Transportation (Medical): Not on file   Lack of Transportation (Non-Medical): Not on file  Physical Activity:    Days of Exercise per Week: Not on file   Minutes of Exercise per Session: Not on file  Stress:     Feeling of Stress : Not on file  Social Connections:    Frequency of Communication with Friends and Family: Not on file   Frequency of Social Gatherings with Friends and Family: Not on file   Attends Religious Services: Not on file   Active Member of Clubs or Organizations: Not on file   Attends Archivist Meetings: Not on file   Marital Status: Not on file  Intimate Partner Violence:    Fear of Current or Ex-Partner: Not on file   Emotionally Abused: Not on file   Physically Abused: Not on file   Sexually Abused: Not on file     Vitals:   02/15/20 0827  BP: 130/84  Pulse: 78  SpO2: 97%  Weight: 179 lb (81.2 kg)  Height: 5' 6.5" (1.689 m)    Wt Readings from Last 3 Encounters:  02/15/20 179 lb (81.2 kg)  09/10/19 175 lb (79.4 kg)  07/27/19 175 lb (79.4 kg)     PHYSICAL EXAM General: NAD HEENT: Normal. Neck: No JVD  No bruits   Lungs: Clear to auscultation bilaterally with normal respiratory effort. CV: Regular rate and rhythm, normal S1/S2, no S3/S4, no murmur. Trivv  LE edema   Abdomen: Soft, nontender, no distention.  Neurologic: Alert and oriented.  Psych: Normal affect. Skin: Normal. Musculoskeletal: No gross deformities.   EKG  SR 78      Labs: Lab Results  Component Value Date/Time   K 3.4 (L) 12/04/2013 11:45 AM   K 4.0 11/12/2011 12:00 AM   BUN 21 03/17/2019 03:17 PM   CREATININE 1.18 03/17/2019 03:17 PM   CREATININE 1.08 06/12/2012 12:20 PM   ALT 22 07/27/2013 02:01 PM   TSH 4.42 11/12/2011 12:00 AM   HGB 14.8 12/04/2013 11:45 AM     Lipids: Lab Results  Component Value Date/Time   LDLCALC 77 07/27/2013 02:01 PM   CHOL 134 07/27/2013 02:01 PM   TRIG 80 07/27/2013 02:01 PM   TRIG 163 05/19/2009 12:00 AM   HDL 41 07/27/2013 02:01 PM       ASSESSMENT AND PLAN:  1. CAD: Doing well  No  CP     2. Essential hypertension:BP is good at home, actually low at times   He denies dizziness   Encouraged him to take cuff   To visit next time for calibraiton   Keep on same meds   3. Hyperlipidemia=  LIpids at goal  contineu  4  Spinal stenosis   Given RX for Celebrex 100 bid    Follow   Fewer GI effects with ASA use than other NSAIDS   If controls pain would be better  Studies snow no signif increased cardiac events from other NSAIDS    Overall goal to limt dosing   WIlll refer to  Creig Hines for evaluation    Disposition: Follow up 6 months   Kate Sable, M.D., F.A.C.C.

## 2020-02-15 NOTE — Telephone Encounter (Signed)
ERROR

## 2020-02-15 NOTE — Telephone Encounter (Signed)
[facility] pharmacy voided rx and said patient will continue to use what he has had in the past.

## 2020-02-15 NOTE — Telephone Encounter (Signed)
New message      Pt c/o medication issue:  1. Name of Medication: celebrex   2. How are you currently taking this medication (dosage and times per day)? He has not started it   3. Are you having a reaction (difficulty breathing--STAT)?  no  4. What is your medication issue? The flyer that he received with the medication said if you have a sulfa allergy do not take this medication , patient wants you to call in something different because he is allergic to sulfa

## 2020-02-15 NOTE — Patient Instructions (Signed)
Medication Instructions:  Your physician recommends that you continue on your current medications as directed. Please refer to the Current Medication list given to you today.  Start Celebrex 100 mg two times daily   *If you need a refill on your cardiac medications before your next appointment, please call your pharmacy*   Lab Work: NONE   If you have labs (blood work) drawn today and your tests are completely normal, you will receive your results only by: Marland Kitchen MyChart Message (if you have MyChart) OR . A paper copy in the mail If you have any lab test that is abnormal or we need to change your treatment, we will call you to review the results.   Testing/Procedures: NONE    Follow-Up: At [facility], you and your health needs are our priority.  As part of our continuing mission to provide you with exceptional heart care, we have created designated Provider Care Teams.  These Care Teams include your primary Cardiologist (physician) and Advanced Practice Providers (APPs -  Physician Assistants and Nurse Practitioners) who all work together to provide you with the care you need, when you need it.  We recommend signing up for the patient portal called "MyChart".  Sign up information is provided on this After Visit Summary.  MyChart is used to connect with patients for Virtual Visits (Telemedicine).  Patients are able to view lab/test results, encounter notes, upcoming appointments, etc.  Non-urgent messages can be sent to your provider as well.   To learn more about what you can do with MyChart, go to [email or web address].    Your next appointment:   6 month(s)  The format for your next appointment:   In Person  Provider:   Dorris Carnes, MD   Other Instructions Thank you for choosing [facility]!

## 2020-02-15 NOTE — Telephone Encounter (Signed)
Patient cannot take celebrex due to sulfa allergy.  Please advise.    To Dr.Ross

## 2020-02-16 NOTE — Addendum Note (Signed)
Addended by: Barbarann Ehlers A on: 02/16/2020 07:55 AM   Modules accepted: Orders

## 2020-02-24 NOTE — Progress Notes (Signed)
Subjective:    CC: Low back pain  I, Brent Brock, LAT, ATC, am serving as scribe for Dr. [name].  HPI: Pt is a 74 y/o male presenting w/ low back pain.  He was referred by his cardiologist, Dr. [name]. Pt reports pain has been ongoing since 3-4 years. Pn started in lower R side and now is all over low back. No pain w/ sitting and laying in bed.  He notes Aleve does help his back pain but he had been advised to discontinue it by his cardiologist.  Radiating pain: no LE numbness/tingling: no LE weakness: no Aggravating factors: raking leaves, brushing Treatments tried: Alieve  Diagnostic imaging: L-spine MRI- 01/26/20; L-spine XR- 01/12/20  Pertinent review of Systems: No fevers or chills  Relevant historical information: Hypertension   Objective:    Vitals:   02/25/20 1029  BP: 120/76  Pulse: 79  SpO2: 97%   General: Well Developed, well nourished, and in no acute distress.   MSK: L-spine normal-appearing Nontender midline. Tender palpation lumbar paraspinal musculature. Normal lumbar motion some pain with extension. Lower extremity strength reflexes and sensation are equal and normal throughout.  Lab and Radiology Results  EXAM: MRI LUMBAR SPINE WITHOUT CONTRAST  TECHNIQUE: Multiplanar, multisequence MR imaging of the lumbar spine was performed. No intravenous contrast was administered.  COMPARISON:  None.  FINDINGS: Segmentation:  Standard.  Alignment:  Trace retrolisthesis at L2-L3.  Vertebrae: Vertebral body heights are maintained. There are few incidental scattered vertebral body hemangiomas, for example at L2. There is no substantial marrow edema. No suspicious osseous lesion.  Conus medullaris and cauda equina: Conus extends to the inferior L1 level. Conus and cauda equina appear normal.  Paraspinal and other soft tissues: Unremarkable.  Disc levels:  L1-L2: Minimal disc bulge. No significant canal or  foraminal stenosis.  L2-L3: Disc bulge. Mild facet arthropathy. Minor canal stenosis. Mild foraminal stenosis.  L3-L4: Disc bulge. Mild facet arthropathy. Minor canal and foraminal stenosis.  L4-L5: Minimal disc bulge. Moderate facet arthropathy. No significant canal stenosis. Minor foraminal stenosis.  L5-S1: Moderate right and mild left facet arthropathy. No significant canal or foraminal stenosis.  IMPRESSION: Mild multilevel degenerative changes as detailed above without high-grade stenosis. There is multilevel facet arthropathy.   Electronically Signed   By: Macy Mis M.D.   On: 01/26/2020 20:03 I, Brent Brock, personally (independently) visualized and performed the interpretation of the images attached in this note.    Impression and Recommendations:    Assessment and Plan: 74 y.o. male with chronic bilateral low back pain without radiculopathy.  Symptoms ongoing for months.  Patient is already had MRI which showed some mild degenerative changes but no severe abnormality.  Discussed options.  Plan for trial of physical therapy.  If this fails would consider facet injections as next step however there are no clear obvious targets.  Check back as needed.  Also recommend heating pad and TENS unit.  Also discussed Tylenol dosing safety.  Recommend avoiding NSAIDs..   Orders Placed This Encounter  Procedures  . Ambulatory referral to Physical Therapy    Referral Priority:   Routine    Referral Type:   Physical Medicine    Referral Reason:   Specialty Services Required    Requested Specialty:   Physical Therapy   No orders of the defined types were placed in this encounter.   Discussed warning signs or symptoms. Please see discharge instructions. Patient expresses understanding.   The above documentation has been reviewed  and is accurate and complete Brent Brock, M.D.

## 2020-02-25 ENCOUNTER — Ambulatory Visit (INDEPENDENT_AMBULATORY_CARE_PROVIDER_SITE_OTHER): Payer: Medicare PPO | Admitting: Family Medicine

## 2020-02-25 ENCOUNTER — Other Ambulatory Visit: Payer: Self-pay

## 2020-02-25 VITALS — BP 120/76 | HR 79 | Ht 66.5 in | Wt 178.4 lb

## 2020-02-25 DIAGNOSIS — S39012A Strain of muscle, fascia and tendon of lower back, initial encounter: Secondary | ICD-10-CM

## 2020-02-25 NOTE — Patient Instructions (Signed)
Thank you for coming in today.  I've referred you to Physical Therapy.  Let us know if you don't hear from them in one week.  Use heating pad.   TENS UNIT: This is helpful for muscle pain and spasm.   Search and Purchase a TENS 7000 2nd edition at  www.tenspros.com or www.[employer].com It should be less than $30.     TENS unit instructions: Do not shower or bathe with the unit on . Turn the unit off before removing electrodes or batteries . If the electrodes lose stickiness add a drop of water to the electrodes after they are disconnected from the unit and place on plastic sheet. If you continued to have difficulty, call the TENS unit company to purchase more electrodes. . Do not apply lotion on the skin area prior to use. Make sure the skin is clean and dry as this will help prolong the life of the electrodes. . After use, always check skin for unusual red areas, rash or other skin difficulties. If there are any skin problems, does not apply electrodes to the same area. . Never remove the electrodes from the unit by pulling the wires. . Do not use the TENS unit or electrodes other than as directed. . Do not change electrode placement without consultating your therapist or physician. Marland Kitchen Keep 2 fingers with between each electrode. . Wear time ratio is 2:1, on to off times.    For example on for 30 minutes off for 15 minutes and then on for 30 minutes off for 15 minutes

## 2020-03-09 ENCOUNTER — Encounter (HOSPITAL_COMMUNITY): Payer: Self-pay | Admitting: Physical Therapy

## 2020-03-09 ENCOUNTER — Other Ambulatory Visit: Payer: Self-pay

## 2020-03-09 ENCOUNTER — Ambulatory Visit (HOSPITAL_COMMUNITY): Payer: Medicare PPO | Attending: Family Medicine | Admitting: Physical Therapy

## 2020-03-09 DIAGNOSIS — R293 Abnormal posture: Secondary | ICD-10-CM | POA: Insufficient documentation

## 2020-03-09 DIAGNOSIS — M545 Low back pain, unspecified: Secondary | ICD-10-CM | POA: Diagnosis not present

## 2020-03-09 NOTE — Patient Instructions (Signed)
Access Code: [email or web address] URL: [email or web address] Date: 03/09/2020 Prepared by: Josue Hector  Exercises Supine Transversus Abdominis Bracing - Hands on Stomach - 3 x daily - 7 x weekly - 2 sets - 10 reps - 5 second hold Supine Bridge - 3 x daily - 7 x weekly - 2 sets - 10 reps - second hold

## 2020-03-09 NOTE — Therapy (Signed)
[facility] [address] [place], [place], [address] Phone: [phone]   Fax:  [phone]  Physical Therapy Evaluation  Patient Details  Name: Brent Brock MRN: [id number] Date of Birth: 01-Oct-1945 Referring Provider (PT): Lynne Leader MD   Encounter Date: 03/09/2020   PT End of Session - 03/09/20 1746    Visit Number 1    Number of Visits 8    Date for PT Re-Evaluation 04/08/20    Authorization Type [employer] (no VL)    Authorization Time Period Check auth    PT Start Time 1645    PT Stop Time 3220    PT Time Calculation (min) 50 min    Activity Tolerance Patient tolerated treatment well    Behavior During Therapy [facility] for tasks assessed/performed           Past Medical History:  Diagnosis Date   Anxiety    ASCVD (arteriosclerotic cardiovascular disease)     PTCA of LAD in 03/1998; recath a few weeks later-no restenosis; apical MI in 04/1998; restenosis of the LAD-stent placed; rotational atherectomy of first diagonal   BPH (benign prostatic hyperplasia)    Chronic prostatitis    Diverticulosis    Elevated PSA    Enlarged prostate    GERD (gastroesophageal reflux disease)    History of coronary angioplasty    Hyperlipidemia    Hypertension    Nicotine dependence    Past use of tobacco    40 pack years discontinued in 03/1998   Seborrheic keratoses    Spondylolysis     Past Surgical History:  Procedure Laterality Date   COLONOSCOPY     CORONARY STENT PLACEMENT     x 2   POLYPECTOMY  1997   Polyps removed on vocal cords     There were no vitals filed for this visit.    Subjective Assessment - 03/09/20 1651    Subjective Patient presents to physical therapy with complaint of low back pain. He had an MRI and showed some issues with his spine. Patient says she has had this ongoing for several years, but has become progressively worse. Patient has been managing symptoms with stretching and  Tylenol PRN. Says pain is worse when he is moving around. It does not bother him when he is sitting or lying down.    Limitations Lifting;Standing;Walking;House hold activities    How long can you stand comfortably? does it with pain    How long can you walk comfortably? does it with pain    Diagnostic tests MRI    Patient Stated Goals "reduce pain"    Currently in Pain? Yes    Pain Score 8     Pain Location Back    Pain Orientation Posterior;Lower    Pain Descriptors / Indicators Aching    Pain Type Chronic pain    Pain Onset More than a month ago    Pain Frequency Intermittent    Aggravating Factors  standing, walking, movement    Pain Relieving Factors sitting, rest, meds    Effect of Pain on Daily Activities Limits              [facility] PT Assessment - 03/09/20 0001      Assessment   Medical Diagnosis Lumbar strain    Referring Provider (PT) Lynne Leader MD    Onset Date/Surgical Date --   Chronic   Prior Therapy No      Precautions   Precautions None  Restrictions   Weight Bearing Restrictions No      Balance Screen   Has the patient fallen in the past 6 months No      [place] residence    Living Arrangements Other relatives      Prior Function   Level of Independence Independent      Cognition   Overall Cognitive Status Within Functional Limits for tasks assessed      Observation/Other Assessments   Focus on Therapeutic Outcomes (FOTO)  47% limited      Posture/Postural Control   Posture/Postural Control Postural limitations    Postural Limitations Increased lumbar lordosis;Anterior pelvic tilt      ROM / Strength   AROM / PROM / Strength AROM;Strength      AROM   AROM Assessment Site Lumbar    Lumbar Flexion WFL    Lumbar Extension 50% limited    Lumbar - Right Side Bend WFL    Lumbar - Left Side Bend WFL    Lumbar - Right Rotation WFL    Lumbar - Left Rotation [facility]      Strength   Strength Assessment  Site Hip;Knee;Ankle    Right/Left Hip Right;Left    Right Hip Flexion 5/5    Right Hip Extension 4+/5    Right Hip ABduction 4+/5    Left Hip Flexion 4+/5    Left Hip Extension 4/5    Left Hip ABduction 4+/5    Right/Left Knee Right;Left    Right Knee Extension 5/5    Left Knee Extension 5/5    Right/Left Ankle Right;Left    Right Ankle Dorsiflexion 5/5    Left Ankle Dorsiflexion 5/5      Palpation   Palpation comment Mod TTP about bilateral lumbar paraspinals                      Objective measurements completed on examination: See above findings.       [facility] Adult PT Treatment/Exercise - 03/09/20 0001      Exercises   Exercises Lumbar      Lumbar Exercises: Supine   Ab Set 5 reps;5 seconds    Bridge 5 reps                  PT Education - 03/09/20 1658    Education Details on evaluation findings, POC and HEP    Person(s) Educated Patient    Methods Explanation;Handout    Comprehension Verbalized understanding            PT Short Term Goals - 03/09/20 1751      PT SHORT TERM GOAL #1   Title Patient will be independent with initial HEP and self-management strategies to improve functional outcomes    Time 2    Period Weeks    Status New    Target Date 03/25/20             PT Long Term Goals - 03/09/20 1752      PT LONG TERM GOAL #1   Title Patient will improve FOTO score by 10% to indicate improvement in functional outcomes    Time 4    Period Weeks    Status New    Target Date 04/08/20      PT LONG TERM GOAL #2   Title Patient will be able to stand > 30 minutes with no increase in lumbar pain to improve ability to perform cooking and grooming ADLs.  Time 4    Period Weeks    Status New    Target Date 04/08/20      PT LONG TERM GOAL #3   Title Patient will report at least 75% overall improvement in subjective complaint to indicate improvement in ability to perform ADLs.    Time 4    Period Weeks    Status New     Target Date 04/08/20      PT LONG TERM GOAL #4   Title Patient will have equal to or > 4+/5 MMT throughout BLE to improve ability to perform functional mobility, stair ambulation and ADLs.    Time 4    Period Weeks    Status New    Target Date 04/08/20                  Plan - 03/09/20 1747    Clinical Impression Statement Patient is a 74 y.o. male who presents to physical therapy with complaint of LBP. Patient demonstrates decreased strength, ROM restriction, increased TTP and postural deficits which are likely contributing to symptoms of pain and are negatively impacting patient ability to perform ADLs and functional mobility tasks. Patient will benefit from skilled physical therapy services to address these deficits to reduce pain and improve level of function with ADLs and functional mobility tasks    Examination-Activity Limitations Lift;Stand;Locomotion Level;Bend;Transfers    Examination-Participation Restrictions Art gallery manager;Yard Work    Stability/Clinical Decision Making Stable/Uncomplicated    Designer, jewellery Low    Rehab Potential Good    PT Frequency 2x / week    PT Duration 4 weeks    PT Treatment/Interventions ADLs/Self Care Home Management;Aquatic Therapy;Biofeedback;Canalith Repostioning;Cryotherapy;Ultrasound;Traction;Neuromuscular re-education;Manual techniques;Patient/family education;Manual lymph drainage;Therapeutic activities;Functional mobility training;Parrafin;Fluidtherapy;Moist Heat;Stair training;Gait training;Iontophoresis 4mg /ml Dexamethasone;DME Instruction;Contrast Bath;Electrical Stimulation;Therapeutic exercise;Balance training;Compression bandaging;Scar mobilization;Taping;Vasopneumatic Device;Joint Manipulations;Vestibular;Passive range of motion;Visual/perceptual remediation/compensation;Orthotic Fit/Training;Dry needling;Energy conservation;Splinting;Spinal Manipulations    PT Next Visit Plan Review goals and HEP. Progress hip  and core strength as tolerated. Add manuals to address pain and restriction in lower lumbar.    PT Home Exercise Plan Eval: ab set, bridge    Consulted and Agree with Plan of Care Patient           Patient will benefit from skilled therapeutic intervention in order to improve the following deficits and impairments:  Pain,Improper body mechanics,Increased fascial restricitons,Postural dysfunction,Decreased activity tolerance,Impaired flexibility,Hypomobility,Decreased strength,Decreased range of motion,Decreased mobility  Visit Diagnosis: Low back pain, unspecified back pain laterality, unspecified chronicity, unspecified whether sciatica present  Abnormal posture     Problem List Patient Active Problem List   Diagnosis Date Noted   Tobacco abuse, in remission    Hyperlipidemia 06/02/2009   Anxiety 06/02/2009   Hypertension 06/02/2009   Arteriosclerotic cardiovascular disease (ASCVD) 06/02/2009   Gastroesophageal reflux 06/02/2009   5:56 PM, 03/09/20 Josue Hector PT DPT  Physical Therapist with [facility]  [phone] [place] [address] [place], [place], [address] Phone: [phone]   Fax:  [phone]  Name: QUIN MATHENIA MRN: [id number] Date of Birth: 04-17-1945

## 2020-03-11 ENCOUNTER — Other Ambulatory Visit: Payer: Self-pay

## 2020-03-11 ENCOUNTER — Encounter (HOSPITAL_COMMUNITY): Payer: Self-pay | Admitting: Physical Therapy

## 2020-03-11 ENCOUNTER — Ambulatory Visit (HOSPITAL_COMMUNITY): Payer: Medicare PPO | Admitting: Physical Therapy

## 2020-03-11 DIAGNOSIS — M545 Low back pain, unspecified: Secondary | ICD-10-CM

## 2020-03-11 DIAGNOSIS — R293 Abnormal posture: Secondary | ICD-10-CM

## 2020-03-11 NOTE — Therapy (Signed)
[facility] [address] [place], [place], [address] Phone: [phone]   Fax:  [phone]  Physical Therapy Treatment  Patient Details  Name: Brent Brock MRN: [id number] Date of Birth: [birth year] Referring Provider (PT): Lynne Leader MD   Encounter Date: 03/11/2020   PT End of Session - 03/11/20 1202    Visit Number 2    Number of Visits 8    Date for PT Re-Evaluation 04/08/20    Authorization Type [employer] (no VL)    Authorization Time Period 8 approved 12-15 thru 03-26-2020    PT Start Time 67    PT Stop Time 1213    PT Time Calculation (min) 43 min    Activity Tolerance Patient tolerated treatment well    Behavior During Therapy [facility] for tasks assessed/performed           Past Medical History:  Diagnosis Date   Anxiety    ASCVD (arteriosclerotic cardiovascular disease)     PTCA of LAD in 03/1998; recath a few weeks later-no restenosis; apical MI in 04/1998; restenosis of the LAD-stent placed; rotational atherectomy of first diagonal   BPH (benign prostatic hyperplasia)    Chronic prostatitis    Diverticulosis    Elevated PSA    Enlarged prostate    GERD (gastroesophageal reflux disease)    History of coronary angioplasty    Hyperlipidemia    Hypertension    Nicotine dependence    Past use of tobacco    40 pack years discontinued in 03/1998   Seborrheic keratoses    Spondylolysis     Past Surgical History:  Procedure Laterality Date   COLONOSCOPY     CORONARY STENT PLACEMENT     x 2   POLYPECTOMY  1997   Polyps removed on vocal cords     There were no vitals filed for this visit.   Subjective Assessment - 03/11/20 1133    Subjective Pt states that his back has not given him much trouble today, his pain varies day to day.  He has not pain going down his leg.    Limitations Lifting;Standing;Walking;House hold activities    How long can you stand comfortably? does it with pain    How  long can you walk comfortably? does it with pain    Diagnostic tests MRI    Patient Stated Goals "reduce pain"    Currently in Pain? Yes    Pain Score 4     Pain Location Back    Pain Orientation Lower;Right    Pain Descriptors / Indicators Aching    Pain Type Chronic pain    Pain Onset More than a month ago    Pain Frequency Intermittent    Aggravating Factors  wt bearing    Pain Relieving Factors rest and meds    Effect of Pain on Daily Activities limits                             [facility] Adult PT Treatment/Exercise - 03/11/20 0001      Exercises   Exercises Lumbar      Lumbar Exercises: Stretches   Active Hamstring Stretch 3 reps;30 seconds    Single Knee to Chest Stretch 3 reps;20 seconds    Standing Extension 10 reps    Prone on Elbows Stretch 1 rep;60 seconds      Lumbar Exercises: Supine   Ab Set 10 reps  Bent Knee Raise 10 reps    Bridge 10 reps    Straight Leg Raise 5 reps      Manual Therapy   Manual Therapy Soft tissue mobilization    Manual therapy comments completed seperate from all other activities    Soft tissue mobilization efflurage/pettrisage to decrease mm tissue tightness                    PT Short Term Goals - 03/11/20 1214      PT SHORT TERM GOAL #1   Title Patient will be independent with initial HEP and self-management strategies to improve functional outcomes    Time 2    Period Weeks    Status On-going    Target Date 03/25/20             PT Long Term Goals - 03/11/20 1215      PT LONG TERM GOAL #1   Title Patient will improve FOTO score by 10% to indicate improvement in functional outcomes    Time 4    Period Weeks    Status On-going      PT LONG TERM GOAL #2   Title Patient will be able to stand > 30 minutes with no increase in lumbar pain to improve ability to perform cooking and grooming ADLs.    Time 4    Period Weeks    Status On-going      PT LONG TERM GOAL #3   Title Patient will  report at least 75% overall improvement in subjective complaint to indicate improvement in ability to perform ADLs.    Time 4    Period Weeks    Status On-going      PT LONG TERM GOAL #4   Title Patient will have equal to or > 4+/5 MMT throughout BLE to improve ability to perform functional mobility, stair ambulation and ADLs.    Time 4    Period Weeks    Status On-going                 Plan - 03/11/20 1210    Clinical Impression Statement Treatment focused on both stretching of lumbar area as well as lumbar stabilization.  PT needed both verbal and tactile cues to perform stabilization correctly.  Goals reviewed with pt    Examination-Activity Limitations Lift;Stand;Locomotion Level;Bend;Transfers    Examination-Participation Restrictions Art gallery manager;Yard Work    Stability/Clinical Decision Making Stable/Uncomplicated    Designer, jewellery Low    Rehab Potential Good    PT Frequency 2x / week    PT Duration 4 weeks    PT Treatment/Interventions ADLs/Self Care Home Management;Aquatic Therapy;Biofeedback;Canalith Repostioning;Cryotherapy;Ultrasound;Traction;Neuromuscular re-education;Manual techniques;Patient/family education;Manual lymph drainage;Therapeutic activities;Functional mobility training;Parrafin;Fluidtherapy;Moist Heat;Stair training;Gait training;Iontophoresis 4mg /ml Dexamethasone;DME Instruction;Contrast Bath;Electrical Stimulation;Therapeutic exercise;Balance training;Compression bandaging;Scar mobilization;Taping;Vasopneumatic Device;Joint Manipulations;Vestibular;Passive range of motion;Visual/perceptual remediation/compensation;Orthotic Fit/Training;Dry needling;Energy conservation;Splinting;Spinal Manipulations    PT Next Visit Plan Progress hip and core strength as tolerated.    PT Home Exercise Plan Eval: ab set, bridge; bent knee raise, knee to chest, hamstring stretch and lumbar extension           Patient will benefit from skilled  therapeutic intervention in order to improve the following deficits and impairments:  Pain,Improper body mechanics,Increased fascial restricitons,Postural dysfunction,Decreased activity tolerance,Impaired flexibility,Hypomobility,Decreased strength,Decreased range of motion,Decreased mobility  Visit Diagnosis: Low back pain, unspecified back pain laterality, unspecified chronicity, unspecified whether sciatica present  Abnormal posture     Problem List Patient Active Problem List   Diagnosis Date Noted  Tobacco abuse, in remission    Hyperlipidemia 06/02/2009   Anxiety 06/02/2009   Hypertension 06/02/2009   Arteriosclerotic cardiovascular disease (ASCVD) 06/02/2009   Gastroesophageal reflux 06/02/2009    Rayetta Humphrey, PT CLT [phone] 03/11/2020, 12:23 PM  [facility] [address] [place], [place], [address] Phone: [phone]   Fax:  [phone]  Name: Brent Brock MRN: [id number] Date of Birth: 06-28-1945

## 2020-03-14 ENCOUNTER — Other Ambulatory Visit: Payer: Self-pay

## 2020-03-14 ENCOUNTER — Encounter (HOSPITAL_COMMUNITY): Payer: Self-pay | Admitting: Physical Therapy

## 2020-03-14 ENCOUNTER — Ambulatory Visit (HOSPITAL_COMMUNITY): Payer: Medicare PPO | Admitting: Physical Therapy

## 2020-03-14 DIAGNOSIS — M545 Low back pain, unspecified: Secondary | ICD-10-CM

## 2020-03-14 DIAGNOSIS — R293 Abnormal posture: Secondary | ICD-10-CM

## 2020-03-14 NOTE — Therapy (Signed)
[place], [place], [address] Phone: [phone]   Fax:  [phone]  Physical Therapy Treatment  Patient Details  Name: Brent Brock MRN: [id number] Date of Birth: 1945/10/27 Referring Provider (PT): Lynne Leader MD   Encounter Date: 03/14/2020   PT End of Session - 03/14/20 0918    Visit Number 3    Number of Visits 8    Date for PT Re-Evaluation 04/08/20    Authorization Type [place] [employer] (no VL)    Authorization Time Period 8 approved 12-15 thru 03-26-2020    PT Start Time 0919    PT Stop Time 0958    PT Time Calculation (min) 39 min    Activity Tolerance Patient tolerated treatment well    Behavior During Therapy [facility] for tasks assessed/performed           Past Medical History:  Diagnosis Date  . Anxiety   . ASCVD (arteriosclerotic cardiovascular disease)     PTCA of LAD in 03/1998; recath a few weeks later-no restenosis; apical MI in 04/1998; restenosis of the LAD-stent placed; rotational atherectomy of first diagonal  . BPH (benign prostatic hyperplasia)   . Chronic prostatitis   . Diverticulosis   . Elevated PSA   . Enlarged prostate   . GERD (gastroesophageal reflux disease)   . History of coronary angioplasty   . Hyperlipidemia   . Hypertension   . Nicotine dependence   . Past use of tobacco    40 pack years discontinued in 03/1998  . Seborrheic keratoses   . Spondylolysis     Past Surgical History:  Procedure Laterality Date  . COLONOSCOPY    . CORONARY STENT PLACEMENT     x 2  . POLYPECTOMY  1997   Polyps removed on vocal cords     There were no vitals filed for this visit.   Subjective Assessment - 03/14/20 0918    Subjective Patient states his back was really bothering him. His home exercises are going well.    Limitations Lifting;Standing;Walking;House hold activities    How long can you stand comfortably? does it with pain    How long can you walk comfortably? does it with  pain    Diagnostic tests MRI    Patient Stated Goals "reduce pain"    Currently in Pain? Yes    Pain Score 4     Pain Location Back    Pain Orientation Lower    Pain Onset More than a month ago                             [facility] Adult PT Treatment/Exercise - 03/14/20 0001      Lumbar Exercises: Stretches   Active Hamstring Stretch 5 reps;10 seconds;Right;Left    Single Knee to Chest Stretch 3 reps;20 seconds    Lower Trunk Rotation Limitations 5-10 second holds x 10 bilateral      Lumbar Exercises: Standing   Functional Squats 10 reps    Functional Squats Limitations mini squat at counter, 2 sets    Other Standing Lumbar Exercises palof press 2x 10 green bilateral      Lumbar Exercises: Sidelying   Hip Abduction Both;10 reps      Lumbar Exercises: Quadruped   Straight Leg Raise 10 reps    Straight Leg Raises Limitations bilateral 2 sets  PT Education - 03/14/20 [phone]    Education Details Patient educated on HEP, exercise mechanics    Person(s) Educated Patient    Methods Explanation;Demonstration    Comprehension Verbalized understanding;Returned demonstration            PT Short Term Goals - 03/11/20 1214      PT SHORT TERM GOAL #1   Title Patient will be independent with initial HEP and self-management strategies to improve functional outcomes    Time 2    Period Weeks    Status On-going    Target Date 03/25/20             PT Long Term Goals - 03/11/20 1215      PT LONG TERM GOAL #1   Title Patient will improve FOTO score by 10% to indicate improvement in functional outcomes    Time 4    Period Weeks    Status On-going      PT LONG TERM GOAL #2   Title Patient will be able to stand > 30 minutes with no increase in lumbar pain to improve ability to perform cooking and grooming ADLs.    Time 4    Period Weeks    Status On-going      PT LONG TERM GOAL #3   Title Patient will report at least 75% overall  improvement in subjective complaint to indicate improvement in ability to perform ADLs.    Time 4    Period Weeks    Status On-going      PT LONG TERM GOAL #4   Title Patient will have equal to or > 4+/5 MMT throughout BLE to improve ability to perform functional mobility, stair ambulation and ADLs.    Time 4    Period Weeks    Status On-going                 Plan - 03/14/20 1962    Clinical Impression Statement Patient requires cueing for prior completed stretch mechanics and cueing for hold times. He c/o slight increase in back pain with prone positioning but tolerates hip extension strengthening exercise. Patient requires min verbal cueing for hip alignment with hip abduction with good carry over. Added palof and squats for additional core/hip strengthening. Patient with excessive anterior tibial/knee translation initially with squats which improves following tactile cueing and demonstration. Patient will continue to benefit from skilled physical therapy in order to reduce impairment and improve function.    Examination-Activity Limitations Lift;Stand;Locomotion Level;Bend;Transfers    Examination-Participation Restrictions Art gallery manager;Yard Work    Stability/Clinical Decision Making Stable/Uncomplicated    Rehab Potential Good    PT Frequency 2x / week    PT Duration 4 weeks    PT Treatment/Interventions ADLs/Self Care Home Management;Aquatic Therapy;Biofeedback;Canalith Repostioning;Cryotherapy;Ultrasound;Traction;Neuromuscular re-education;Manual techniques;Patient/family education;Manual lymph drainage;Therapeutic activities;Functional mobility training;Parrafin;Fluidtherapy;Moist Heat;Stair training;Gait training;Iontophoresis 4mg /ml Dexamethasone;DME Instruction;Contrast Bath;Electrical Stimulation;Therapeutic exercise;Balance training;Compression bandaging;Scar mobilization;Taping;Vasopneumatic Device;Joint Manipulations;Vestibular;Passive range of  motion;Visual/perceptual remediation/compensation;Orthotic Fit/Training;Dry needling;Energy conservation;Splinting;Spinal Manipulations    PT Next Visit Plan Progress hip and core strength as tolerated.    PT Home Exercise Plan Eval: ab set, bridge; bent knee raise, knee to chest, hamstring stretch and lumbar extension 12/20 palof press           Patient will benefit from skilled therapeutic intervention in order to improve the following deficits and impairments:  Pain,Improper body mechanics,Increased fascial restricitons,Postural dysfunction,Decreased activity tolerance,Impaired flexibility,Hypomobility,Decreased strength,Decreased range of motion,Decreased mobility  Visit Diagnosis: Low back pain, unspecified back pain laterality, unspecified chronicity, unspecified whether  sciatica present  Abnormal posture     Problem List Patient Active Problem List   Diagnosis Date Noted  . Tobacco abuse, in remission   . Hyperlipidemia 06/02/2009  . Anxiety 06/02/2009  . Hypertension 06/02/2009  . Arteriosclerotic cardiovascular disease (ASCVD) 06/02/2009  . Gastroesophageal reflux 06/02/2009    10:00 AM, 03/14/20 Mearl Latin PT, DPT Physical Therapist at [place], [place], [address] Phone: [phone]   Fax:  [phone]  Name: Brent Brock MRN: [id number] Date of Birth: 08-Oct-1945

## 2020-03-22 ENCOUNTER — Ambulatory Visit (HOSPITAL_COMMUNITY): Payer: Medicare PPO | Admitting: Physical Therapy

## 2020-03-22 ENCOUNTER — Encounter (HOSPITAL_COMMUNITY): Payer: Self-pay | Admitting: Physical Therapy

## 2020-03-22 ENCOUNTER — Other Ambulatory Visit: Payer: Self-pay

## 2020-03-22 DIAGNOSIS — M545 Low back pain, unspecified: Secondary | ICD-10-CM | POA: Diagnosis not present

## 2020-03-22 DIAGNOSIS — R293 Abnormal posture: Secondary | ICD-10-CM

## 2020-03-22 NOTE — Therapy (Signed)
[facility] [address] [place], [place], [address] Phone: [phone]   Fax:  [phone]  Physical Therapy Treatment  Patient Details  Name: Brent Brock MRN: [id number] Date of Birth: 12-02-1945 Referring Provider (PT): Clementeen Graham MD   Encounter Date: 03/22/2020   PT End of Session - 03/22/20 0911    Visit Number 4    Number of Visits 8    Date for PT Re-Evaluation 04/08/20    Authorization Type [employer] (no VL)    Authorization Time Period 8 approved 12-15 thru 04-08-2020    PT Start Time 0914    PT Stop Time 0954    PT Time Calculation (min) 40 min    Activity Tolerance Patient tolerated treatment well    Behavior During Therapy [facility] for tasks assessed/performed           Past Medical History:  Diagnosis Date  . Anxiety   . ASCVD (arteriosclerotic cardiovascular disease)     PTCA of LAD in 03/1998; recath a few weeks later-no restenosis; apical MI in 04/1998; restenosis of the LAD-stent placed; rotational atherectomy of first diagonal  . BPH (benign prostatic hyperplasia)   . Chronic prostatitis   . Diverticulosis   . Elevated PSA   . Enlarged prostate   . GERD (gastroesophageal reflux disease)   . History of coronary angioplasty   . Hyperlipidemia   . Hypertension   . Nicotine dependence   . Past use of tobacco    40 pack years discontinued in 03/1998  . Seborrheic keratoses   . Spondylolysis     Past Surgical History:  Procedure Laterality Date  . COLONOSCOPY    . CORONARY STENT PLACEMENT     x 2  . POLYPECTOMY  1997   Polyps removed on vocal cords     There were no vitals filed for this visit.   Subjective Assessment - 03/22/20 0913    Subjective Patient states he has had a few days with pain but is overall feeling better. He feels the original couple exercises help the most.    Limitations Lifting;Standing;Walking;House hold activities    How long can you stand comfortably? does it with pain     How long can you walk comfortably? does it with pain    Diagnostic tests MRI    Patient Stated Goals "reduce pain"    Currently in Pain? No/denies    Pain Onset More than a month ago                             [facility] Adult PT Treatment/Exercise - 03/22/20 0001      Lumbar Exercises: Standing   Other Standing Lumbar Exercises lateral stepping 4x 15 feet      Lumbar Exercises: Supine   Bridge 20 reps    Bridge with clamshell 10 reps    Bridge with [employer] Limitations 2 sets    Straight Leg Raise 10 reps    Straight Leg Raises Limitations with cueing for ab set      Lumbar Exercises: Prone   Straight Leg Raise 10 reps    Straight Leg Raises Limitations bilateral 2 sets                  PT Education - 03/22/20 0912    Education Details Patient educated on HEP, exercise mechanics, discussed POC    Person(s) Educated Patient    Methods Explanation;Demonstration  Comprehension Verbalized understanding;Returned demonstration            PT Short Term Goals - 03/11/20 1214      PT SHORT TERM GOAL #1   Title Patient will be independent with initial HEP and self-management strategies to improve functional outcomes    Time 2    Period Weeks    Status On-going    Target Date 03/25/20             PT Long Term Goals - 03/11/20 1215      PT LONG TERM GOAL #1   Title Patient will improve FOTO score by 10% to indicate improvement in functional outcomes    Time 4    Period Weeks    Status On-going      PT LONG TERM GOAL #2   Title Patient will be able to stand > 30 minutes with no increase in lumbar pain to improve ability to perform cooking and grooming ADLs.    Time 4    Period Weeks    Status On-going      PT LONG TERM GOAL #3   Title Patient will report at least 75% overall improvement in subjective complaint to indicate improvement in ability to perform ADLs.    Time 4    Period Weeks    Status On-going      PT LONG TERM GOAL #4    Title Patient will have equal to or > 4+/5 MMT throughout BLE to improve ability to perform functional mobility, stair ambulation and ADLs.    Time 4    Period Weeks    Status On-going                 Plan - 03/22/20 0912    Clinical Impression Statement Patient feels most benefit with bridge exercise today. Patient given cueing for core activation with all exercises completed. Added bridge with clam today for additional core/hip strengthening which patient states feels good. Patient with minimal discomfort in low back following lateral stepping. Discussed POC and patient will return to review current HEP, reassess progress, and review home estim unit. Patient will continue to benefit from skilled physical therapy in order to reduce impairment and improve function.    Examination-Activity Limitations Lift;Stand;Locomotion Level;Bend;Transfers    Examination-Participation Restrictions Art gallery manager;Yard Work    Stability/Clinical Decision Making Stable/Uncomplicated    Rehab Potential Good    PT Frequency 2x / week    PT Duration 4 weeks    PT Treatment/Interventions ADLs/Self Care Home Management;Aquatic Therapy;Biofeedback;Canalith Repostioning;Cryotherapy;Ultrasound;Traction;Neuromuscular re-education;Manual techniques;Patient/family education;Manual lymph drainage;Therapeutic activities;Functional mobility training;Parrafin;Fluidtherapy;Moist Heat;Stair training;Gait training;Iontophoresis 4mg /ml Dexamethasone;DME Instruction;Contrast Bath;Electrical Stimulation;Therapeutic exercise;Balance training;Compression bandaging;Scar mobilization;Taping;Vasopneumatic Device;Joint Manipulations;Vestibular;Passive range of motion;Visual/perceptual remediation/compensation;Orthotic Fit/Training;Dry needling;Energy conservation;Splinting;Spinal Manipulations    PT Next Visit Plan Progress hip and core strength as tolerated. review HEP, reassess, review home estim unit    PT Home  Exercise Plan Eval: ab set, bridge; bent knee raise, knee to chest, hamstring stretch and lumbar extension 12/20 palof press 12/28 prone hip extension, lateral stepping, bridge with clam           Patient will benefit from skilled therapeutic intervention in order to improve the following deficits and impairments:  Pain,Improper body mechanics,Increased fascial restricitons,Postural dysfunction,Decreased activity tolerance,Impaired flexibility,Hypomobility,Decreased strength,Decreased range of motion,Decreased mobility  Visit Diagnosis: Low back pain, unspecified back pain laterality, unspecified chronicity, unspecified whether sciatica present  Abnormal posture     Problem List Patient Active Problem List   Diagnosis Date Noted  . Tobacco  abuse, in remission   . Hyperlipidemia 06/02/2009  . Anxiety 06/02/2009  . Hypertension 06/02/2009  . Arteriosclerotic cardiovascular disease (ASCVD) 06/02/2009  . Gastroesophageal reflux 06/02/2009    10:05 AM, 03/22/20 Wyman Songster PT, DPT Physical Therapist at [facility]  [facility] [address] [place], [place], [address] Phone: [phone]   Fax:  [phone]  Name: DARROW BARREIRO MRN: [id number] Date of Birth: 1946/03/26

## 2020-03-22 NOTE — Patient Instructions (Signed)
Access Code: [email or web address] URL: [email or web address] Date: 03/22/2020 Prepared by: Greig Castilla Miquela Costabile  Exercises Supine Bridge with Resistance Band - 1 x daily - 7 x weekly - 2 sets - 10 reps Side Stepping with Resistance at Thighs - 1 x daily - 7 x weekly - 2 sets - 10 reps Prone Hip Extension - 1 x daily - 7 x weekly - 2 sets - 10 reps

## 2020-03-24 ENCOUNTER — Encounter (HOSPITAL_COMMUNITY): Payer: Self-pay | Admitting: Physical Therapy

## 2020-03-24 ENCOUNTER — Ambulatory Visit (HOSPITAL_COMMUNITY): Payer: Medicare PPO | Admitting: Physical Therapy

## 2020-03-24 ENCOUNTER — Other Ambulatory Visit: Payer: Self-pay

## 2020-03-24 DIAGNOSIS — M545 Low back pain, unspecified: Secondary | ICD-10-CM

## 2020-03-24 DIAGNOSIS — R293 Abnormal posture: Secondary | ICD-10-CM

## 2020-03-24 NOTE — Therapy (Signed)
[facility] [address] [place], [place], [address] Phone: [phone]   Fax:  [phone]  Physical Therapy Treatment/Discharge Summary  Patient Details  Name: Brent Brock MRN: [id number] Date of Birth: 1946-03-01 Referring Provider (PT): Lynne Leader MD   Encounter Date: 03/24/2020   PHYSICAL THERAPY DISCHARGE SUMMARY  Visits from Start of Care: 5  Current functional level related to goals / functional outcomes: See below   Remaining deficits: See below   Education / Equipment: See below  Plan: Patient agrees to discharge.  Patient goals were partially met. Patient is being discharged due to being pleased with the current functional level.  ?????       PT End of Session - 03/24/20 1043    Visit Number 5    Number of Visits 8    Date for PT Re-Evaluation 04/08/20    Authorization Type [employer] (no VL)    Authorization Time Period 8 approved 12-15 thru 04-08-2020    PT Start Time 1045    PT Stop Time 1120    PT Time Calculation (min) 35 min    Activity Tolerance Patient tolerated treatment well    Behavior During Therapy [facility] for tasks assessed/performed           Past Medical History:  Diagnosis Date  . Anxiety   . ASCVD (arteriosclerotic cardiovascular disease)     PTCA of LAD in 03/1998; recath a few weeks later-no restenosis; apical MI in 04/1998; restenosis of the LAD-stent placed; rotational atherectomy of first diagonal  . BPH (benign prostatic hyperplasia)   . Chronic prostatitis   . Diverticulosis   . Elevated PSA   . Enlarged prostate   . GERD (gastroesophageal reflux disease)   . History of coronary angioplasty   . Hyperlipidemia   . Hypertension   . Nicotine dependence   . Past use of tobacco    40 pack years discontinued in 03/1998  . Seborrheic keratoses   . Spondylolysis     Past Surgical History:  Procedure Laterality Date  . COLONOSCOPY    . CORONARY STENT PLACEMENT     x 2  .  POLYPECTOMY  1997   Polyps removed on vocal cords     There were no vitals filed for this visit.   Subjective Assessment - 03/24/20 1044    Subjective Patient states he feels good about the exercises. He feels like the day isnt complete if he doesnt do his exercises. Pain is not as intense as it was with standing and ADL. Patient states about 20% improvement since beginning therapy.    Limitations Lifting;Standing;Walking;House hold activities    How long can you stand comfortably? does it with pain    How long can you walk comfortably? does it with pain    Diagnostic tests MRI    Patient Stated Goals "reduce pain"    Currently in Pain? No/denies    Pain Onset More than a month ago              [facility] PT Assessment - 03/24/20 0001      Assessment   Medical Diagnosis Lumbar strain    Referring Provider (PT) Lynne Leader MD    Prior Therapy No      Precautions   Precautions None      Restrictions   Weight Bearing Restrictions No      Balance Screen   Has the patient fallen in the past 6 months No  Home Environment   Living Environment Private residence    Living Arrangements Other relatives      Prior Function   Level of Independence Independent      Cognition   Overall Cognitive Status Within Functional Limits for tasks assessed      Observation/Other Assessments   Focus on Therapeutic Outcomes (FOTO)  37% limited      Strength   Right Hip Flexion 5/5    Right Hip Extension 5/5    Right Hip ABduction 5/5    Left Hip Flexion 5/5    Left Hip Extension 5/5    Left Hip ABduction 5/5                         [facility] Adult PT Treatment/Exercise - 03/24/20 0001      Lumbar Exercises: Supine   Ab Set 10 reps    Bent Knee Raise 10 reps    Bridge 20 reps    Bridge Limitations 2 sets                  PT Education - 03/24/20 1044    Education Details Patient educated on HEP, exercise mechanics, estim unit, returning to PT if needed,  progress made, estim parameters    Person(s) Educated Patient    Methods Explanation;Demonstration    Comprehension Verbalized understanding;Returned demonstration            PT Short Term Goals - 03/24/20 1050      PT SHORT TERM GOAL #1   Title Patient will be independent with initial HEP and self-management strategies to improve functional outcomes    Time 2    Period Weeks    Status Achieved    Target Date 03/25/20             PT Long Term Goals - 03/24/20 1050      PT LONG TERM GOAL #1   Title Patient will improve FOTO score by 10% to indicate improvement in functional outcomes    Time 4    Period Weeks    Status Achieved      PT LONG TERM GOAL #2   Title Patient will be able to stand > 30 minutes with no increase in lumbar pain to improve ability to perform cooking and grooming ADLs.    Time 4    Period Weeks    Status Partially Met      PT LONG TERM GOAL #3   Title Patient will report at least 75% overall improvement in subjective complaint to indicate improvement in ability to perform ADLs.    Time 4    Period Weeks    Status Not Met      PT LONG TERM GOAL #4   Title Patient will have equal to or > 4+/5 MMT throughout BLE to improve ability to perform functional mobility, stair ambulation and ADLs.    Time 4    Period Weeks    Status Achieved                 Plan - 03/24/20 1043    Clinical Impression Statement Patient has met 1/1 short term goals and 3/4 long term goals with 1 of those being partially met. Goals met with ability to complete HEP and improving symptoms, activity tolerance, strength, and ambulation ability. Patient remains limited by increased symptoms with longer duration standing/ ambulating/ ADL. Patient completes exercises today without cueing for mechanics. Patient educated on progress  made and returning to PT if needed. Patient educated on how to use his estim unit for pain modulation and Estim parameters. Patient discharged from  physical therapy at this time.    Examination-Activity Limitations Lift;Stand;Locomotion Level;Bend;Transfers    Examination-Participation Restrictions Art gallery manager;Yard Work    Stability/Clinical Decision Making Stable/Uncomplicated    Rehab Potential Good    PT Frequency 2x / week    PT Duration 4 weeks    PT Treatment/Interventions ADLs/Self Care Home Management;Aquatic Therapy;Biofeedback;Canalith Repostioning;Cryotherapy;Ultrasound;Traction;Neuromuscular re-education;Manual techniques;Patient/family education;Manual lymph drainage;Therapeutic activities;Functional mobility training;Parrafin;Fluidtherapy;Moist Heat;Stair training;Gait training;Iontophoresis 56m/ml Dexamethasone;DME Instruction;Contrast Bath;Electrical Stimulation;Therapeutic exercise;Balance training;Compression bandaging;Scar mobilization;Taping;Vasopneumatic Device;Joint Manipulations;Vestibular;Passive range of motion;Visual/perceptual remediation/compensation;Orthotic Fit/Training;Dry needling;Energy conservation;Splinting;Spinal Manipulations    PT Next Visit Plan n/a    PT Home Exercise Plan Eval: ab set, bridge; bent knee raise, knee to chest, hamstring stretch and lumbar extension 12/20 palof press 12/28 prone hip extension, lateral stepping, bridge with clam           Patient will benefit from skilled therapeutic intervention in order to improve the following deficits and impairments:  Pain,Improper body mechanics,Increased fascial restricitons,Postural dysfunction,Decreased activity tolerance,Impaired flexibility,Hypomobility,Decreased strength,Decreased range of motion,Decreased mobility  Visit Diagnosis: Low back pain, unspecified back pain laterality, unspecified chronicity, unspecified whether sciatica present  Abnormal posture     Problem List Patient Active Problem List   Diagnosis Date Noted  . Tobacco abuse, in remission   . Hyperlipidemia 06/02/2009  . Anxiety 06/02/2009  .  Hypertension 06/02/2009  . Arteriosclerotic cardiovascular disease (ASCVD) 06/02/2009  . Gastroesophageal reflux 06/02/2009    11:32 AM, 03/24/20 AMearl LatinPT, DPT [place] [address]Phone: [phone]  Fax:  [phone] Name: PMAURIZIO GENOMRN: [id number] of Birth: 111/16/1947

## 2020-03-30 ENCOUNTER — Encounter (HOSPITAL_COMMUNITY): Payer: Self-pay | Admitting: Physical Therapy

## 2020-06-13 ENCOUNTER — Emergency Department (HOSPITAL_COMMUNITY)
Admission: EM | Admit: 2020-06-13 | Discharge: 2020-06-13 | Disposition: A | Discharge status: Home / Self Care | Payer: Medicare PPO | Attending: Emergency Medicine | Admitting: Emergency Medicine

## 2020-06-13 ENCOUNTER — Other Ambulatory Visit: Payer: Self-pay

## 2020-06-13 ENCOUNTER — Encounter (HOSPITAL_COMMUNITY): Payer: Self-pay | Admitting: *Deleted

## 2020-06-13 ENCOUNTER — Emergency Department (HOSPITAL_COMMUNITY): Payer: Medicare PPO

## 2020-06-13 DIAGNOSIS — Z20822 Contact with and (suspected) exposure to covid-19: Secondary | ICD-10-CM | POA: Diagnosis not present

## 2020-06-13 DIAGNOSIS — Z79899 Other long term (current) drug therapy: Secondary | ICD-10-CM | POA: Diagnosis not present

## 2020-06-13 DIAGNOSIS — J029 Acute pharyngitis, unspecified: Secondary | ICD-10-CM | POA: Diagnosis present

## 2020-06-13 DIAGNOSIS — Z7982 Long term (current) use of aspirin: Secondary | ICD-10-CM | POA: Diagnosis not present

## 2020-06-13 DIAGNOSIS — I10 Essential (primary) hypertension: Secondary | ICD-10-CM | POA: Insufficient documentation

## 2020-06-13 DIAGNOSIS — Z955 Presence of coronary angioplasty implant and graft: Secondary | ICD-10-CM | POA: Diagnosis not present

## 2020-06-13 DIAGNOSIS — B349 Viral infection, unspecified: Secondary | ICD-10-CM

## 2020-06-13 DIAGNOSIS — Z87891 Personal history of nicotine dependence: Secondary | ICD-10-CM | POA: Insufficient documentation

## 2020-06-13 LAB — GROUP A STREP BY PCR: Group A Strep by PCR: NOT DETECTED

## 2020-06-13 LAB — SARS CORONAVIRUS 2 (TAT 6-24 HRS): SARS Coronavirus 2: NEGATIVE

## 2020-06-13 MED ORDER — BENZONATATE 100 MG PO CAPS: 100.0000 mg | ORAL_CAPSULE | Freq: Three times a day (TID) | ORAL | 0 refills | Status: DC

## 2020-06-13 NOTE — ED Triage Notes (Signed)
Sore throat with nasal congestion

## 2020-06-13 NOTE — ED Provider Notes (Signed)
[facility] EMERGENCY DEPARTMENT Provider Note   CSN: [id number] Arrival date & time: 06/13/20  1157     History Chief Complaint  Patient presents with   Sore Throat    Brent Brock is a 75 y.o. male with PMhx HTN, HLD, GERD, BPH who presents to the ED today with complaint of gradual onset, constant, achy, sore throat x 3 days. Pt also complains of nasal congestion, sinus pressure, and a dry cough.  Patient reports he lives with his grandson who recently had Covid last week.  He states he lives on the opposite side of the house and they tried to stay away from 1 another.  Patient is vaccinated x3.  He took an at home Covid test about 3 days ago and his symptoms started which was negative.  He called his PCP today requesting medication for his symptoms however his PCP advised him to come to the ED for further evaluation.  Patient reports he is having pain with swallowing however he is still tolerating his own secretions without difficulty.  He has not taken much for his symptoms as he is not sure what he can take that does not interact with his blood pressure medication.  This is why he called his PCP today for guidance.  He denies any fevers or chills.   The history is provided by the patient and medical records.       Past Medical History:  Diagnosis Date   Anxiety    ASCVD (arteriosclerotic cardiovascular disease)     PTCA of LAD in 03/1998; recath a few weeks later-no restenosis; apical MI in 04/1998; restenosis of the LAD-stent placed; rotational atherectomy of first diagonal   BPH (benign prostatic hyperplasia)    Chronic prostatitis    Diverticulosis    Elevated PSA    Enlarged prostate    GERD (gastroesophageal reflux disease)    History of coronary angioplasty    Hyperlipidemia    Hypertension    Nicotine dependence    Past use of tobacco    40 pack years discontinued in 03/1998   Seborrheic keratoses    Spondylolysis     Patient Active Problem List    Diagnosis Date Noted   Tobacco abuse, in remission    Hyperlipidemia 06/02/2009   Anxiety 06/02/2009   Hypertension 06/02/2009   Arteriosclerotic cardiovascular disease (ASCVD) 06/02/2009   Gastroesophageal reflux 06/02/2009    Past Surgical History:  Procedure Laterality Date   COLONOSCOPY     CORONARY STENT PLACEMENT     x 2   POLYPECTOMY  1997   Polyps removed on vocal cords        Family History  Problem Relation Age of Onset   Heart attack Mother    Heart attack Father    Heart disease Father    Breast cancer Daughter        age 43    CAD Other    Colon cancer Neg Hx     Social History   Tobacco Use   Smoking status: Former Smoker    Packs/day: 1.00    Years: 30.00    Pack years: 30.00    Types: Pipe    Start date: 03/26/1960    Quit date: 06/21/1998    Years since quitting: 21.9   Smokeless tobacco: Never Used  Vaping Use   Vaping Use: Never used  Substance Use Topics   Alcohol use: No    Alcohol/week: 0.0 standard drinks   Drug use: No  Home Medications Prior to Admission medications   Medication Sig Start Date End Date Taking? Authorizing Provider  benzonatate (TESSALON) 100 MG capsule Take 1 capsule (100 mg total) by mouth every 8 (eight) hours. 06/13/20  Yes Orla Jolliff, PA-C  amLODipine (NORVASC) 5 MG tablet Take 1 tablet (5 mg total) by mouth daily. 07/27/19   Herminio Commons, MD  aspirin EC 81 MG tablet Take 81 mg by mouth daily.    [provider]  atorvastatin (LIPITOR) 20 MG tablet TAKE ONE (1) TABLET BY MOUTH EVERY DAY 07/27/19   Herminio Commons, MD  esomeprazole (NEXIUM) 40 MG capsule Take 40 mg by mouth every other day.    [provider]  finasteride (PROSCAR) 5 MG tablet Take 5 mg by mouth daily. 01/07/20   [provider]  lisinopril-hydrochlorothiazide (ZESTORETIC) 20-12.5 MG tablet Take 1 tablet by mouth daily. 07/27/19   Herminio Commons, MD  metoprolol succinate (TOPROL-XL)  50 MG 24 hr tablet TAKE ONE (1) TABLET BY MOUTH EVERY DAY 07/27/19   Herminio Commons, MD  nitroGLYCERIN (NITROSTAT) 0.4 MG SL tablet Place 1 tablet (0.4 mg total) under the tongue every 5 (five) minutes as needed for chest pain. Patient not taking: Reported on 02/25/2020 07/27/19   Herminio Commons, MD    Allergies    Yellow jacket venom [bee venom], Penicillins, and Sulfa antibiotics  Review of Systems   Review of Systems  Constitutional: Positive for fatigue. Negative for chills and fever.  HENT: Positive for congestion, sinus pressure and sore throat. Negative for trouble swallowing and voice change.   Respiratory: Positive for cough. Negative for shortness of breath.   Gastrointestinal: Negative for diarrhea.  All other systems reviewed and are negative.   Physical Exam Updated Vital Signs BP (!) 134/101    Pulse (!) 104    Temp 98.8 F (37.1 C) (Oral)    Resp 20    SpO2 99%   Physical Exam Vitals and nursing note reviewed.  Constitutional:      Appearance: He is not ill-appearing or diaphoretic.  HENT:     Head: Normocephalic and atraumatic.     Right Ear: Tympanic membrane normal.     Left Ear: Tympanic membrane normal.     Nose: Congestion present.     Right Sinus: Maxillary sinus tenderness present. No frontal sinus tenderness.     Left Sinus: Maxillary sinus tenderness present. No frontal sinus tenderness.     Mouth/Throat:     Mouth: Mucous membranes are moist.     Pharynx: Uvula midline. Posterior oropharyngeal erythema present. No oropharyngeal exudate or uvula swelling.  Eyes:     Conjunctiva/sclera: Conjunctivae normal.  Cardiovascular:     Rate and Rhythm: Normal rate and regular rhythm.     Heart sounds: Normal heart sounds.  Pulmonary:     Effort: Pulmonary effort is normal.     Breath sounds: Normal breath sounds. No wheezing, rhonchi or rales.  Musculoskeletal:     Cervical back: Normal range of motion.  Lymphadenopathy:     Cervical: No cervical  adenopathy.  Skin:    General: Skin is warm and dry.     Coloration: Skin is not jaundiced.  Neurological:     Mental Status: He is alert.     ED Results / Procedures / Treatments   Labs (all labs ordered are listed, but only abnormal results are displayed) Labs Reviewed  GROUP A STREP BY PCR  SARS CORONAVIRUS 2 (TAT 6-24  HRS)    EKG None  Radiology DG Chest Port 1 View  Result Date: 06/13/2020 CLINICAL DATA:  Cough, sore throat and nasal congestion. EXAM: PORTABLE CHEST 1 VIEW COMPARISON:  Chest radiograph and CT chest 12/04/2013. FINDINGS: Trachea is midline. Heart size stable. Lungs are clear. No pleural fluid. IMPRESSION: No acute findings. Electronically Signed   By: Lorin Picket M.D.   On: 06/13/2020 13:26    Procedures Procedures   Medications Ordered in ED Medications - No data to display  ED Course  I have reviewed the triage vital signs and the nursing notes.  Pertinent labs & imaging results that were available during my care of the patient were reviewed by me and considered in my medical decision making (see chart for details).    MDM Rules/Calculators/A&P                          75 year old male who presents to the ED today with URI-like symptoms for the past 2 days.  Grandson who lives with him had Covid last week.  Patient is vaccinated x3.  Negative Covid test at home.  He was advised by his PCP to come to the ED for further evaluation.  Does report pain with swallowing however is able to tolerate secretions without difficulty.  He is phonating normally.  On arrival to the ED he is mildly tachycardic at 104 however this is resolved while he is in the room.  Afebrile, nontachypneic.  Satting 99% on room air.  His lungs are clear to auscultation bilaterally is able speak in full sentences without difficulty.  He does have some erythema to his posterior oropharynx.  Uvula is midline.  He has some nasal congestion as well as bilateral maxillary sinus  tenderness palpation.  TMs clear.  Given he is having sore throat will swab for strep at this time however I am more suspicious for Covid given recent sick contact.  We will plan for Covid test as well however will need send out test this patient is overall well-appearing and does not need admission.  Will also obtain a chest x-ray to rule out pneumonia.  Patient in agreement with plan at this time.  Strep test negative CXR clear  Will discharge pt home at this time with PCP follow up and instructions to await his COVID test results. Will prescribe tessalon perles for comfort. Pt is only on day 3 of symptoms and should his COVID test return positive he can follow up with PCP or Post COVID care clinic for medical management/antiviral medications. He is in agreement with plan and stable for discharge home.   This note was prepared using Dragon voice recognition software and may include unintentional dictation errors due to the inherent limitations of voice recognition software.  Brent Brock was evaluated in Emergency Department on 06/13/2020 for the symptoms described in the history of present illness. He was evaluated in the context of the global COVID-19 pandemic, which necessitated consideration that the patient might be at risk for infection with the SARS-CoV-2 virus that causes COVID-19. Institutional protocols and algorithms that pertain to the evaluation of patients at risk for COVID-19 are in a state of rapid change based on information released by regulatory bodies including the CDC and federal and state organizations. These policies and algorithms were followed during the patient's care in the ED.  Final Clinical Impression(s) / ED Diagnoses Final diagnoses:  Viral illness    Rx / DC  Orders ED Discharge Orders         Ordered    benzonatate (TESSALON) 100 MG capsule  Every 8 hours        06/13/20 1410           Discharge Instructions     Your strep test and chest xray were  negative. Your COVID test is pending and should result within in the next 24 hours. We will call you if you test POSITIVE. You can also log into MyChart and check the results that way.   I have prescribed cough medication to take as needed. Continue taking OTC medications for symptom relief. You can take Dayquil and Nyquil.   Please follow up with your PCP regarding your ED visit today. You can also follow up with the [facility] if you COVID test if positive for further management of your symptoms.  Return to the ED IMMEDIATELY for any worsening symptoms including shortness of breath, severe chest pain, passing out, lips/fingers turning blue, inability to awaken easily, new onset confusion, or any other new/concerning symptoms.         Eustaquio Maize, PA-C 06/13/20 [place], MD 06/14/20 [phone]

## 2020-06-13 NOTE — ED Notes (Signed)
Entered room and introduced self to patient. Pt appears to be resting in bed, respirations are even and unlabored with equal chest rise and fall. Bed is locked in the lowest position, side rails x2, call bell within reach. Pt educated on call light use and hourly rounding, verbalized understanding and in agreement at this time. All questions and concerns voiced addressed. Refreshments offered and provided per patient request.  

## 2020-06-13 NOTE — ED Notes (Signed)
X Ray at bedside at this time.  

## 2020-06-13 NOTE — ED Notes (Signed)
ED Provider at bedside. 

## 2020-06-13 NOTE — Discharge Instructions (Addendum)
Your strep test and chest xray were negative. Your COVID test is pending and should result within in the next 24 hours. We will call you if you test POSITIVE. You can also log into MyChart and check the results that way.   I have prescribed cough medication to take as needed. Continue taking OTC medications for symptom relief. You can take Dayquil and Nyquil.   Please follow up with your PCP regarding your ED visit today. You can also follow up with the [facility] if you COVID test if positive for further management of your symptoms.  Return to the ED IMMEDIATELY for any worsening symptoms including shortness of breath, severe chest pain, passing out, lips/fingers turning blue, inability to awaken easily, new onset confusion, or any other new/concerning symptoms.

## 2020-08-11 ENCOUNTER — Other Ambulatory Visit (HOSPITAL_COMMUNITY): Payer: Self-pay | Admitting: Psychiatry

## 2020-08-11 ENCOUNTER — Telehealth: Payer: Self-pay

## 2020-08-11 MED ORDER — LISINOPRIL-HYDROCHLOROTHIAZIDE 20-12.5 MG PO TABS: 1.0000 | ORAL_TABLET | Freq: Every day | ORAL | 3 refills | Status: DC

## 2020-08-11 NOTE — Telephone Encounter (Signed)
Medication refill request approved for Lisinopril-Hydrochlorothiazide 20-12.5 mg tablets and sent to [facility].

## 2020-08-12 ENCOUNTER — Other Ambulatory Visit: Payer: Self-pay | Admitting: Internal Medicine

## 2020-08-15 ENCOUNTER — Ambulatory Visit: Payer: Medicare PPO | Admitting: Internal Medicine

## 2020-09-11 NOTE — Progress Notes (Signed)
Pt presents for f/u of CAD  SUBJECTIVE: Brent Brock is a 75y.o. male with a history of a myocardial infarction in 2000 requiring percutaneous coronary intervention ( [name]). He also has a history of hypertension, hyperlipidemia, mild CV dz (USN in 2018) and a former history of tobacco abuse. 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   Review of Systems: As per "subjective", otherwise negative.  Allergies  Allergen Reactions   Yellow Jacket Venom [Bee Venom] Swelling   Penicillins Other (See Comments)    Thrush   Sulfa Antibiotics Rash    Current Outpatient Medications  Medication Sig Dispense Refill   amLODipine (NORVASC) 5 MG tablet Take 1 tablet (5 mg total) by mouth daily. Pt must keep appt in June for further refills 90 tablet 1   aspirin EC 81 MG tablet Take 81 mg by mouth daily.     atorvastatin (LIPITOR) 20 MG tablet TAKE ONE (1) TABLET BY MOUTH EVERY DAY.  Pt must keep appt in June for further refills 90 tablet 1   benzonatate (TESSALON) 100 MG capsule Take 1 capsule (100 mg total) by mouth every 8 (eight) hours. 21 capsule 0   esomeprazole (NEXIUM) 40 MG capsule Take 40 mg by mouth every other day.     finasteride (PROSCAR) 5 MG tablet Take 5 mg by mouth daily.     lisinopril-hydrochlorothiazide (ZESTORETIC) 20-12.5 MG tablet Take 1 tablet by mouth daily. 90 tablet 3   metoprolol succinate (TOPROL-XL) 50 MG 24 hr tablet TAKE ONE (1) TABLET BY MOUTH EVERY DAY.  Pt must keep appt in June for further refills 90 tablet 1   nitroGLYCERIN (NITROSTAT) 0.4 MG SL tablet Place 1 tablet (0.4 mg total) under the tongue every 5 (five) minutes as needed for chest pain. (Patient not taking: Reported on 02/25/2020) 25 tablet 3   No current facility-administered medications for this  visit.    Past Medical History:  Diagnosis Date   Anxiety    ASCVD (arteriosclerotic cardiovascular disease)     PTCA of LAD in 03/1998; recath a few weeks later-no restenosis; apical MI in 04/1998; restenosis of the LAD-stent placed; rotational atherectomy of first diagonal   BPH (benign prostatic hyperplasia)    Chronic prostatitis    Diverticulosis    Elevated PSA    Enlarged prostate    GERD (gastroesophageal reflux disease)    History of coronary angioplasty    Hyperlipidemia    Hypertension    Nicotine dependence    Past use of tobacco    40 pack years discontinued in 03/1998   Seborrheic keratoses    Spondylolysis     Past Surgical History:  Procedure Laterality Date   COLONOSCOPY     CORONARY STENT PLACEMENT     x 2   POLYPECTOMY  1997   Polyps removed on vocal cords     Social History   Socioeconomic History   Marital status: Widowed    Spouse name: Not on file   Number of children: Not on file   Years of education: Not on file   Highest education level: Not on file  Occupational History  Occupation: Professor    Comment: [employer]  Tobacco Use   Smoking status: Former    Packs/day: 1.00    Years: 30.00    Pack years: 30.00    Types: Pipe, Cigarettes    Start date: 03/26/1960    Quit date: 06/21/1998    Years since quitting: 22.2   Smokeless tobacco: Never  Vaping Use   Vaping Use: Never used  Substance and Sexual Activity   Alcohol use: No    Alcohol/week: 0.0 standard drinks   Drug use: No   Sexual activity: Never  Other Topics Concern   Not on file  Social History Narrative   Lives w/ Wife   Lost only daughter from metastatic breast cancer    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   Wt Readings from Last 3 Encounters:  02/25/20 178 lb 6.4 oz (80.9 kg)  02/15/20 179 lb (81.2 kg)  09/10/19 175 lb (79.4 kg)     PHYSICAL EXAM BP 104/70  P 90  Sat 98%  Wt 176 lb General: NAD HEENT: Normal. Neck: No JVD  No audible bruits   Lungs: Clear to auscultation bilaterally CV: Regular rate and rhythm, normal S1/S2,  No murmurs   Trace  LE edema   Abdomen: Soft, nontender, no distention.  Neurologic: Alert and oriented.  Psych: Normal affect. Skin: Normal. Musculoskeletal: No gross deformities.   EKG   Not done today     Labs: Lab Results  Component Value Date/Time   K 3.4 (L) 12/04/2013 11:45 AM   K 4.0 11/12/2011 12:00 AM   BUN 21 03/17/2019 03:17 PM   CREATININE 1.18 03/17/2019 03:17 PM   CREATININE 1.08 06/12/2012 12:20 PM   ALT 22 07/27/2013 02:01 PM   TSH 4.42 11/12/2011 12:00 AM   HGB 14.8 12/04/2013 11:45 AM     Lipids: Lab Results  Component Value Date/Time   LDLCALC 77 07/27/2013 02:01 PM   CHOL 134 07/27/2013 02:01 PM   TRIG 80 07/27/2013 02:01 PM   TRIG 163 05/19/2009 12:00 AM   HDL 41 07/27/2013 02:01 PM       ASSESSMENT AND PLAN:  1. CAD:  Remote intervention    Continues to remain asymptomatic  Follow      2. Essential hypertension:BP is a little low at times   He denies dizziness  REcom:  Trial of 2.5 amlodipine (down from current 5)   Follow BP   ALso follow LE edema  He has trace   I have asked him to mail in logs   Goal  SBP 100s to 120s   3  Hyperlipidemia=  Continue to follow       4 CV dz  Mild plaquing of carotids several years ago   Keep on 81 ASA     4  DJD  Pt with knee arthritis    OK to proceed with surgery if planned    Plan for f/u Dec/Jan       Dorris Carnes MD

## 2020-09-12 ENCOUNTER — Encounter: Payer: Self-pay | Admitting: Internal Medicine

## 2020-09-12 ENCOUNTER — Ambulatory Visit: Payer: Medicare PPO | Admitting: Internal Medicine

## 2020-09-12 ENCOUNTER — Other Ambulatory Visit: Payer: Self-pay

## 2020-09-12 VITALS — BP 104/70 | HR 90 | Ht 66.0 in | Wt 176.4 lb

## 2020-09-12 DIAGNOSIS — I2510 Atherosclerotic heart disease of native coronary artery without angina pectoris: Secondary | ICD-10-CM | POA: Diagnosis not present

## 2020-09-12 NOTE — Patient Instructions (Signed)
Medication Instructions:  Your physician recommends that you continue on your current medications as directed. Please refer to the Current Medication list given to you today.  *If you need a refill on your cardiac medications before your next appointment, please call your pharmacy*   Lab Work: None If you have labs (blood work) drawn today and your tests are completely normal, you will receive your results only by: [place] (if you have MyChart) OR A paper copy in the mail If you have any lab test that is abnormal or we need to change your treatment, we will call you to review the results.   Testing/Procedures: None   Follow-Up: At [facility], you and your health needs are our priority.  As part of our continuing mission to provide you with exceptional heart care, we have created designated Provider Care Teams.  These Care Teams include your primary Cardiologist (physician) and Advanced Practice Providers (APPs -  Physician Assistants and Nurse Practitioners) who all work together to provide you with the care you need, when you need it.  We recommend signing up for the patient portal called "MyChart".  Sign up information is provided on this After Visit Summary.  MyChart is used to connect with patients for Virtual Visits (Telemedicine).  Patients are able to view lab/test results, encounter notes, upcoming appointments, etc.  Non-urgent messages can be sent to your provider as well.   To learn more about what you can do with MyChart, go to [email or web address].    Your next appointment:    End of December/January with Dr. [name]

## 2020-10-19 ENCOUNTER — Other Ambulatory Visit: Payer: Self-pay

## 2020-10-19 ENCOUNTER — Ambulatory Visit
Admission: EM | Admit: 2020-10-19 | Discharge: 2020-10-19 | Disposition: A | Discharge status: Home / Self Care | Payer: Medicare PPO | Attending: Family Medicine | Admitting: Family Medicine

## 2020-10-19 ENCOUNTER — Ambulatory Visit (INDEPENDENT_AMBULATORY_CARE_PROVIDER_SITE_OTHER): Payer: Medicare PPO

## 2020-10-19 DIAGNOSIS — R52 Pain, unspecified: Secondary | ICD-10-CM

## 2020-10-19 DIAGNOSIS — R1032 Left lower quadrant pain: Secondary | ICD-10-CM

## 2020-10-19 MED ORDER — METRONIDAZOLE 500 MG PO TABS: 500.0000 mg | ORAL_TABLET | Freq: Three times a day (TID) | ORAL | 0 refills | Status: DC

## 2020-10-19 MED ORDER — CIPROFLOXACIN HCL 500 MG PO TABS: 500.0000 mg | ORAL_TABLET | Freq: Two times a day (BID) | ORAL | 0 refills | Status: DC

## 2020-10-19 NOTE — ED Triage Notes (Signed)
Patient presents to Urgent Care with complaints of abdominal pain x 1 week. Pt states last BM 2 days ago. He reports one episode of diarrhea Monday. Has a hx of diverticulitis. Treating with heating pad and reports taking old prescription of Cipro.   Denies fever, n/v.

## 2020-10-19 NOTE — Discharge Instructions (Addendum)

## 2020-10-19 NOTE — ED Provider Notes (Signed)
[place]   [id number] 10/19/20 Arrival Time: [id number]  ASSESSMENT & PLAN:  1. Abdominal pain, left lower quadrant    No indications for urgent abdominal/pelvic imaging at this time.  Elects empiric tx for diverticulitis. Begin: Meds ordered this encounter  Medications   ciprofloxacin (CIPRO) 500 MG tablet    Sig: Take 1 tablet (500 mg total) by mouth 2 (two) times daily.    Dispense:  20 tablet    Refill:  0   metroNIDAZOLE (FLAGYL) 500 MG tablet    Sig: Take 1 tablet (500 mg total) by mouth 3 (three) times daily.    Dispense:  30 tablet    Refill:  0     Discharge Instructions      You have been seen today for abdominal pain. Your evaluation was not suggestive of any emergent condition requiring medical intervention at this time. However, some abdominal problems make take more time to appear. Therefore, it is very important for you to pay attention to any new symptoms or worsening of your current condition.  Please return here or to the Emergency Department immediately should you begin to feel worse in any way or have any of the following symptoms: increasing or different abdominal pain, persistent vomiting, inability to drink fluids, fevers, or shaking chills.       Follow-up Information     [facility] EMERGENCY DEPARTMENT.   Specialty: Emergency Medicine Why: If worsening or failing to improve as anticipated. Contact information: [address] [id number] Prudy Feeler [place] [id number] [phone]               Reviewed expectations re: course of current medical issues. Questions answered. Outlined signs and symptoms indicating need for more acute intervention. Patient verbalized understanding. After Visit Summary given.   SUBJECTIVE: History from: patient. Brent Brock is a 75 y.o. male who presents with complaint of intermittent LLQ abdominal discomfort. Onset gradual,  over past week . Discomfort described as cramping and dull;  without radiation; does not wake him at night. Reports normal flatus. Symptoms are unchanged since beginning. Fever: absent. Aggravating factors: have not been identified. Alleviating factors: have not been identified. He denies chills and fever. One non-bloody loose stool today. Appetite: decreased. PO intake: decreased. Ambulatory without assistance. Urinary symptoms: none. History of similar: yes ("feels like when I had diverticulitis"). OTC treatment: Cipro, left over from 1 y ago; past two days; some help.  No LMP for male patient. Past Surgical History:  Procedure Laterality Date   COLONOSCOPY     CORONARY STENT PLACEMENT     x 2   POLYPECTOMY  1997   Polyps removed on vocal cords      OBJECTIVE:  Vitals:   10/19/20 1032  BP: 113/80  Pulse: (!) 103  Resp: 16  Temp: 97.9 F (36.6 C)  TempSrc: Temporal  SpO2: 97%    General appearance: alert, oriented, no acute distress HEENT: [place]; AT; oropharynx moist Lungs: unlabored respirations Abdomen: soft; without distention; mild  and poorly localized tenderness to palpation over LLQ ; without masses or organomegaly; without guarding or rebound tenderness Back: without reported CVA tenderness; FROM at waist Extremities: without LE edema; symmetrical; without gross deformities Skin: warm and dry Neurologic: normal gait Psychological: alert and cooperative; normal mood and affect  Imaging: DG Abd 2 Views  Result Date: 10/19/2020 CLINICAL DATA:  pain; r/o SBO EXAM: ABDOMEN - 2 VIEW COMPARISON:  None. FINDINGS: The bowel gas pattern is normal. There is no  evidence of free air. No radio-opaque calculi or other significant radiographic abnormality is seen. IMPRESSION: Negative. Electronically Signed   By: Rolm Baptise M.D.   On: 10/19/2020 11:40     Allergies  Allergen Reactions   Yellow Jacket Venom [Bee Venom] Swelling   Penicillins Other (See Comments)    Thrush   Sulfa Antibiotics Rash                                                Past Medical History:  Diagnosis Date   Anxiety    ASCVD (arteriosclerotic cardiovascular disease)     PTCA of LAD in 03/1998; recath a few weeks later-no restenosis; apical MI in 04/1998; restenosis of the LAD-stent placed; rotational atherectomy of first diagonal   BPH (benign prostatic hyperplasia)    Chronic prostatitis    Diverticulosis    Elevated PSA    Enlarged prostate    GERD (gastroesophageal reflux disease)    History of coronary angioplasty    Hyperlipidemia    Hypertension    Nicotine dependence    Past use of tobacco    40 pack years discontinued in 03/1998   Seborrheic keratoses    Spondylolysis     Social History   Socioeconomic History   Marital status: Widowed    Spouse name: Not on file   Number of children: Not on file   Years of education: Not on file   Highest education level: Not on file  Occupational History   Occupation: Professor    Comment: [employer]  Tobacco Use   Smoking status: Former    Packs/day: 1.00    Years: 30.00    Pack years: 30.00    Types: Pipe, Cigarettes    Start date: 03/26/1960    Quit date: 06/21/1998    Years since quitting: 22.3   Smokeless tobacco: Never  Vaping Use   Vaping Use: Never used  Substance and Sexual Activity   Alcohol use: No    Alcohol/week: 0.0 standard drinks   Drug use: No   Sexual activity: Never  Other Topics Concern   Not on file  Social History Narrative   Lives w/ Wife   Lost only daughter from metastatic breast cancer    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

## 2020-11-01 ENCOUNTER — Other Ambulatory Visit (HOSPITAL_COMMUNITY): Payer: Self-pay | Admitting: Family Medicine

## 2020-11-01 DIAGNOSIS — R1032 Left lower quadrant pain: Secondary | ICD-10-CM

## 2020-11-01 DIAGNOSIS — R1031 Right lower quadrant pain: Secondary | ICD-10-CM

## 2020-11-02 ENCOUNTER — Other Ambulatory Visit: Payer: Self-pay

## 2020-11-02 ENCOUNTER — Ambulatory Visit (HOSPITAL_COMMUNITY)
Admission: RE | Admit: 2020-11-02 | Discharge: 2020-11-02 | Disposition: A | Discharge status: Home / Self Care | Payer: Medicare PPO | Source: Ambulatory Visit | Attending: Family Medicine | Admitting: Family Medicine

## 2020-11-02 DIAGNOSIS — R1032 Left lower quadrant pain: Secondary | ICD-10-CM | POA: Insufficient documentation

## 2020-11-02 DIAGNOSIS — R1031 Right lower quadrant pain: Secondary | ICD-10-CM | POA: Insufficient documentation

## 2020-11-02 LAB — POCT I-STAT CREATININE: Creatinine, Ser: 1 mg/dL (ref 0.61–1.24)

## 2020-11-02 MED ORDER — IOHEXOL 350 MG/ML SOLN
80.0000 mL | Freq: Once | INTRAVENOUS | Status: AC | PRN
  Administered 2020-11-02: 80 mL via INTRAVENOUS

## 2021-03-01 ENCOUNTER — Other Ambulatory Visit: Payer: Self-pay | Admitting: Internal Medicine

## 2021-03-01 NOTE — Telephone Encounter (Signed)
This is a [place] pt.  °

## 2021-03-14 ENCOUNTER — Encounter: Payer: Self-pay | Admitting: Internal Medicine

## 2021-03-14 ENCOUNTER — Ambulatory Visit: Payer: Medicare PPO | Admitting: Internal Medicine

## 2021-03-14 VITALS — BP 124/84 | HR 85 | Ht 67.0 in | Wt 177.0 lb

## 2021-03-14 DIAGNOSIS — E785 Hyperlipidemia, unspecified: Secondary | ICD-10-CM | POA: Diagnosis not present

## 2021-03-14 DIAGNOSIS — I2510 Atherosclerotic heart disease of native coronary artery without angina pectoris: Secondary | ICD-10-CM

## 2021-03-14 DIAGNOSIS — Z131 Encounter for screening for diabetes mellitus: Secondary | ICD-10-CM

## 2021-03-14 MED ORDER — ATORVASTATIN CALCIUM 40 MG PO TABS: 40.0000 mg | ORAL_TABLET | Freq: Every day | ORAL | 3 refills | Status: DC

## 2021-03-14 MED ORDER — NITROGLYCERIN 0.4 MG SL SUBL: 0.4000 mg | SUBLINGUAL_TABLET | SUBLINGUAL | 3 refills | Status: DC | PRN

## 2021-03-14 NOTE — Progress Notes (Signed)
Pt presents for f/u of CAD  SUBJECTIVE: Brent Brock is a 75y.o. male with a history of a myocardial infarction in 2000 requiring percutaneous coronary intervention ( [name]). He also has a history of hypertension, hyperlipidemia, mild CV dz (USN in 2018) and a former history of tobacco abuse. I last saw the pt in clinic in June 2022   His BP was low at the time   I recomm cutting back on amlodipine    Since seen he says he actually did not decrease amlodipine   He denies dizziness   Breathing is good   Says he feels good    BP readings from home range from 90s to 120./     DIet:   Goes out for breakfast (egg biscuit)   Lunch  Can skp Chiropodist and veggies   Review of Systems: As per "subjective", otherwise negative.  Allergies  Allergen Reactions   Yellow Jacket Venom [Bee Venom] Swelling   Penicillins Other (See Comments)    Thrush   Sulfa Antibiotics Rash    Current Outpatient Medications  Medication Sig Dispense Refill   amLODipine (NORVASC) 5 MG tablet Take 1 tablet (5 mg total) by mouth daily. Pt must keep appt in June for further refills 90 tablet 1   aspirin EC 81 MG tablet Take 81 mg by mouth daily.     atorvastatin (LIPITOR) 20 MG tablet TAKE ONE (1) TABLET BY MOUTH EVERY DAY.  Pt must keep appt in June for further refills 90 tablet 1   esomeprazole (NEXIUM) 40 MG capsule Take 40 mg by mouth every other day.     finasteride (PROSCAR) 5 MG tablet Take 5 mg by mouth daily.     lisinopril-hydrochlorothiazide (ZESTORETIC) 20-12.5 MG tablet Take 1 tablet by mouth daily. 90 tablet 3   metoprolol succinate (TOPROL-XL) 50 MG 24 hr tablet TAKE ONE (1) TABLET BY MOUTH EVERY DAY 90 tablet 1   ciprofloxacin (CIPRO) 500 MG tablet Take 1 tablet (500 mg total) by mouth 2 (two) times daily. (Patient not taking: Reported on 03/14/2021) 20 tablet 0   metroNIDAZOLE (FLAGYL) 500 MG tablet Take 1 tablet (500 mg total) by mouth 3 (three) times daily. (Patient not taking: Reported on  03/14/2021) 30 tablet 0   nitroGLYCERIN (NITROSTAT) 0.4 MG SL tablet Place 1 tablet (0.4 mg total) under the tongue every 5 (five) minutes as needed for chest pain. 25 tablet 3   No current facility-administered medications for this visit.    Past Medical History:  Diagnosis Date   Anxiety    ASCVD (arteriosclerotic cardiovascular disease)     PTCA of LAD in 03/1998; recath a few weeks later-no restenosis; apical MI in 04/1998; restenosis of the LAD-stent placed; rotational atherectomy of first diagonal   BPH (benign prostatic hyperplasia)    Chronic prostatitis    Diverticulosis    Elevated PSA    Enlarged prostate    GERD (gastroesophageal reflux disease)    History of coronary angioplasty    Hyperlipidemia    Hypertension    Nicotine dependence    Past use of tobacco    40 pack years discontinued in 03/1998   Seborrheic keratoses    Spondylolysis     Past Surgical History:  Procedure Laterality Date   COLONOSCOPY     CORONARY STENT PLACEMENT     x 2   POLYPECTOMY  1997   Polyps removed on vocal cords     Social History  Socioeconomic History   Marital status: Widowed    Spouse name: Not on file   Number of children: Not on file   Years of education: Not on file   Highest education level: Not on file  Occupational History   Occupation: Professor    Comment: [employer]  Tobacco Use   Smoking status: Former    Packs/day: 1.00    Years: 30.00    Pack years: 30.00    Types: Pipe, Cigarettes    Start date: 03/26/1960    Quit date: 06/21/1998    Years since quitting: 22.7   Smokeless tobacco: Never  Vaping Use   Vaping Use: Never used  Substance and Sexual Activity   Alcohol use: No    Alcohol/week: 0.0 standard drinks   Drug use: No   Sexual activity: Never  Other Topics Concern   Not on file  Social History Narrative   Lives w/ Wife   Lost only daughter from metastatic breast cancer    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[employer]   Social Determinants  of Health   Financial Resource Strain: Not on file  Food Insecurity: Not on file  Transportation Needs: Not on file  Physical Activity: Not on file  Stress: Not on file  Social Connections: Not on file  Intimate Partner Violence: Not on file     Vitals:   03/14/21 0910  BP: 124/84  Pulse: 85  SpO2: 99%  Weight: 177 lb (80.3 kg)  Height: 5\' 7"  (1.702 m)     Wt Readings from Last 3 Encounters:  03/14/21 177 lb (80.3 kg)  09/12/20 176 lb 6.4 oz (80 kg)  02/25/20 178 lb 6.4 oz (80.9 kg)     PHYSICAL EXAM BP 104/70  P 90  Sat 98%  Wt 176 lb General: NAD HEENT: Normal. Neck: No JVD  No bruits   Lungs: Clear to auscultation bilaterally CV: Regular rate and rhythm, normal S1/S2,  No murmurs   Triv  LE edema   Abdomen: Soft, nontender, no distention.  Neurologic: Alert and oriented.  Psych: Normal affect. Skin: Normal.  Skin in legs purpulish  Musculoskeletal: No gross deformities.   EKG   SR 63 bpm   Labs: Lab Results  Component Value Date/Time   K 3.4 (L) 12/04/2013 11:45 AM   K 4.0 11/12/2011 12:00 AM   BUN 21 03/17/2019 03:17 PM   CREATININE 1.00 11/02/2020 08:18 AM   CREATININE 1.08 06/12/2012 12:20 PM   ALT 22 07/27/2013 02:01 PM   TSH 4.42 11/12/2011 12:00 AM   HGB 14.8 12/04/2013 11:45 AM     Lipids: Lab Results  Component Value Date/Time   LDLCALC 77 07/27/2013 02:01 PM   CHOL 134 07/27/2013 02:01 PM   TRIG 80 07/27/2013 02:01 PM   TRIG 163 05/19/2009 12:00 AM   HDL 41 07/27/2013 02:01 PM       ASSESSMENT AND PLAN:  1. CAD:  Remote intervention    Remains asymptomatic    2. Essential hypertension:BP is labile   She denies dizziness    3  Hyperlipidemia  Will recheck lipids       4 CV dz  Mild plaquing of carotids several years ago   Keep on 81 ASA  and statin   Dorris Carnes MD

## 2021-03-14 NOTE — Patient Instructions (Signed)
Medication Instructions:  Your physician has recommended you make the following change in your medication:  INCREASE Lipitor to 40 mg tablets daily  *If you need a refill on your cardiac medications before your next appointment, please call your pharmacy*   Lab Work: IN MARCH: A1C LIPIDS CBC  If you have labs (blood work) drawn today and your tests are completely normal, you will receive your results only by: [place] (if you have MyChart) OR A paper copy in the mail If you have any lab test that is abnormal or we need to change your treatment, we will call you to review the results.   Testing/Procedures: None   Follow-Up: At [facility], you and your health needs are our priority.  As part of our continuing mission to provide you with exceptional heart care, we have created designated Provider Care Teams.  These Care Teams include your primary Cardiologist (physician) and Advanced Practice Providers (APPs -  Physician Assistants and Nurse Practitioners) who all work together to provide you with the care you need, when you need it.  We recommend signing up for the patient portal called "MyChart".  Sign up information is provided on this After Visit Summary.  MyChart is used to connect with patients for Virtual Visits (Telemedicine).  Patients are able to view lab/test results, encounter notes, upcoming appointments, etc.  Non-urgent messages can be sent to your provider as well.   To learn more about what you can do with MyChart, go to [email or web address].    Your next appointment:   July/August 2023  The format for your next appointment:   In Person  Provider:   Dorris Carnes, MD    Other Instructions

## 2021-04-17 ENCOUNTER — Other Ambulatory Visit: Payer: Self-pay | Admitting: Internal Medicine

## 2021-06-15 ENCOUNTER — Telehealth: Payer: Self-pay | Admitting: Internal Medicine

## 2021-06-15 NOTE — Telephone Encounter (Signed)
Patient has a referral to switch providers from Dr. [name] to Dr. [place]. Please advise.  ?

## 2021-06-15 NOTE — Telephone Encounter (Signed)
OK with switch ?

## 2021-08-16 ENCOUNTER — Ambulatory Visit
Admission: EM | Admit: 2021-08-16 | Discharge: 2021-08-16 | Disposition: A | Discharge status: Home / Self Care | Payer: Medicare PPO | Attending: Family Medicine | Admitting: Family Medicine

## 2021-08-16 DIAGNOSIS — R21 Rash and other nonspecific skin eruption: Secondary | ICD-10-CM | POA: Diagnosis not present

## 2021-08-16 MED ORDER — CLOBETASOL PROPIONATE 0.05 % EX OINT: 1.0000 "application " | TOPICAL_OINTMENT | Freq: Two times a day (BID) | CUTANEOUS | 0 refills | Status: DC

## 2021-08-16 NOTE — ED Provider Notes (Signed)
[facility]    CSN: [id number] Arrival date & time: 08/16/21  1034      History   Chief Complaint Chief Complaint  Patient presents with   Hand Problem    Left and right hand issues    HPI Brent Brock is a 76 y.o. male.   Presenting today with several day history of left hand peeling, irritation, itching that is now starting on the right palm as well to a lesser extent.  He denies any new lotions, soaps, foods, outdoor exposures.  The only new change recently as he just got off clindamycin about a week or so ago for a root canal.  He has tried hydrocortisone cream with no relief.  No history of similar issues.   Past Medical History:  Diagnosis Date   Anxiety    ASCVD (arteriosclerotic cardiovascular disease)     PTCA of LAD in 03/1998; recath a few weeks later-no restenosis; apical MI in 04/1998; restenosis of the LAD-stent placed; rotational atherectomy of first diagonal   BPH (benign prostatic hyperplasia)    Chronic prostatitis    Diverticulosis    Elevated PSA    Enlarged prostate    GERD (gastroesophageal reflux disease)    History of coronary angioplasty    Hyperlipidemia    Hypertension    Nicotine dependence    Past use of tobacco    40 pack years discontinued in 03/1998   Seborrheic keratoses    Spondylolysis     Patient Active Problem List   Diagnosis Date Noted   Tobacco abuse, in remission    Hyperlipidemia 06/02/2009   Anxiety 06/02/2009   Hypertension 06/02/2009   Arteriosclerotic cardiovascular disease (ASCVD) 06/02/2009   Gastroesophageal reflux 06/02/2009    Past Surgical History:  Procedure Laterality Date   COLONOSCOPY     CORONARY STENT PLACEMENT     x 2   POLYPECTOMY  1997   Polyps removed on vocal cords        Home Medications    Prior to Admission medications   Medication Sig Start Date End Date Taking? Authorizing Provider  clobetasol ointment (TEMOVATE) 9.37 % Apply 1 application. topically 2 (two) times  daily. 08/16/21  Yes Volney American, PA-C  clobetasol ointment (TEMOVATE) 3.42 % Apply 1 application. topically 2 (two) times daily. 08/16/21  Yes Volney American, PA-C  amLODipine (NORVASC) 5 MG tablet TAKE ONE TABLET ('5MG'$  TOTAL) BY MOUTH DAILY 04/17/21   Fay Records, MD  aspirin EC 81 MG tablet Take 81 mg by mouth daily.    [provider]  atorvastatin (LIPITOR) 40 MG tablet Take 1 tablet (40 mg total) by mouth daily. 03/14/21   Fay Records, MD  ciprofloxacin (CIPRO) 500 MG tablet Take 1 tablet (500 mg total) by mouth 2 (two) times daily. Patient not taking: Reported on 03/14/2021 10/19/20   Vanessa Kick, MD  esomeprazole (NEXIUM) 40 MG capsule Take 40 mg by mouth every other day.    [provider]  finasteride (PROSCAR) 5 MG tablet Take 5 mg by mouth daily. 01/07/20   [provider]  lisinopril-hydrochlorothiazide (ZESTORETIC) 20-12.5 MG tablet Take 1 tablet by mouth daily. 08/11/20   Fay Records, MD  metoprolol succinate (TOPROL-XL) 50 MG 24 hr tablet TAKE ONE (1) TABLET BY MOUTH EVERY DAY 03/01/21   Fay Records, MD  metroNIDAZOLE (FLAGYL) 500 MG tablet Take 1 tablet (500 mg total) by mouth 3 (three) times daily. Patient not taking: Reported on  03/14/2021 10/19/20   Vanessa Kick, MD  nitroGLYCERIN (NITROSTAT) 0.4 MG SL tablet Place 1 tablet (0.4 mg total) under the tongue every 5 (five) minutes as needed for chest pain. 03/14/21   Fay Records, MD    Family History Family History  Problem Relation Age of Onset   Heart attack Mother    Heart attack Father    Heart disease Father    Breast cancer Daughter        age 65    CAD Other    Colon cancer Neg Hx     Social History Social History   Tobacco Use   Smoking status: Former    Packs/day: 1.00    Years: 30.00    Pack years: 30.00    Types: Pipe, Cigarettes    Start date: 03/26/1960    Quit date: 06/21/1998    Years since quitting: 23.1   Smokeless tobacco: Never  Vaping Use    Vaping Use: Never used  Substance Use Topics   Alcohol use: Yes    Comment: Occas   Drug use: No     Allergies   Yellow jacket venom [bee venom], Penicillins, and Sulfa antibiotics   Review of Systems Review of Systems Per HPI  Physical Exam Triage Vital Signs ED Triage Vitals  Enc Vitals Group     BP 08/16/21 1125 104/71     Pulse Rate 08/16/21 1125 90     Resp 08/16/21 1125 20     Temp 08/16/21 1125 98.4 F (36.9 C)     Temp Source 08/16/21 1125 Oral     SpO2 08/16/21 1125 95 %     Weight --      Height --      Head Circumference --      Peak Flow --      Pain Score 08/16/21 1129 3     Pain Loc --      Pain Edu? --      Excl. in [place]? --    No data found.  Updated Vital Signs BP 104/71 (BP Location: Right Arm)   Pulse 90   Temp 98.4 F (36.9 C) (Oral)   Resp 20   SpO2 95%   Visual Acuity Right Eye Distance:   Left Eye Distance:   Bilateral Distance:    Right Eye Near:   Left Eye Near:    Bilateral Near:     Physical Exam Vitals and nursing note reviewed.  Constitutional:      Appearance: Normal appearance.  HENT:     Head: Atraumatic.  Eyes:     Extraocular Movements: Extraocular movements intact.     Conjunctiva/sclera: Conjunctivae normal.  Cardiovascular:     Rate and Rhythm: Normal rate and regular rhythm.  Pulmonary:     Effort: Pulmonary effort is normal.     Breath sounds: Normal breath sounds.  Musculoskeletal:        General: Normal range of motion.     Cervical back: Normal range of motion and neck supple.  Skin:    General: Skin is warm.     Findings: Rash present.     Comments: No erythema, papules or other lesions to bilateral palms but dry peeling sporadically across palms.  No other skin changes diffusely across remainder of body  Neurological:     General: No focal deficit present.     Mental Status: He is oriented to person, place, and time.  Psychiatric:  Mood and Affect: Mood normal.        Thought Content:  Thought content normal.        Judgment: Judgment normal.   UC Treatments / Results  Labs (all labs ordered are listed, but only abnormal results are displayed) Labs Reviewed - No data to display  EKG   Radiology No results found.  Procedures Procedures (including critical care time)  Medications Ordered in UC Medications - No data to display  Initial Impression / Assessment and Plan / UC Course  I have reviewed the triage vital signs and the nursing notes.  Pertinent labs & imaging results that were available during my care of the patient were reviewed by me and considered in my medical decision making (see chart for details).     Unclear etiology, possibly irritation dermatitis.  Very low suspicion that this is antibiotic reaction such as Stevens-Johnson as he completed the antibiotic over a week ago and had no issues while taking the antibiotic.  We will treat with clobetasol ointment, good moisturizing regimen but discussed that if symptoms became more widespread that he needed to go to the emergency department immediately.  Final Clinical Impressions(s) / UC Diagnoses   Final diagnoses:  Rash   Discharge Instructions   None    ED Prescriptions     Medication Sig Dispense Auth. Provider   clobetasol ointment (TEMOVATE) 6.80 % Apply 1 application. topically 2 (two) times daily. 30 g Volney American, [place]   clobetasol ointment (TEMOVATE) 3.21 % Apply 1 application. topically 2 (two) times daily. 60 g Volney American, [place]      PDMP not reviewed this encounter.   Volney American, [place] 08/16/21 1158

## 2021-08-16 NOTE — ED Triage Notes (Signed)
Pt states that earlier this week he woke up and his left hand was peeling severely and it had some pain in it as well   Pt states that his right hand also started peeling and having a tingling sensation and itching  Pt states he was on an antibiotic for his root canal and when he finished the antibiotic that is when the hand peeling started  Pt states he tried Hydrocortisone Cream without any relief  Denies changing hand lotions or soaps

## 2021-08-25 ENCOUNTER — Other Ambulatory Visit (HOSPITAL_COMMUNITY): Payer: Self-pay | Admitting: Family Medicine

## 2021-08-25 ENCOUNTER — Other Ambulatory Visit: Payer: Self-pay | Admitting: Family Medicine

## 2021-08-25 DIAGNOSIS — R1084 Generalized abdominal pain: Secondary | ICD-10-CM

## 2021-09-04 ENCOUNTER — Encounter (HOSPITAL_COMMUNITY): Payer: Self-pay

## 2021-09-04 ENCOUNTER — Ambulatory Visit (HOSPITAL_COMMUNITY)
Admission: RE | Admit: 2021-09-04 | Discharge: 2021-09-04 | Disposition: A | Discharge status: Home / Self Care | Payer: Medicare PPO | Source: Ambulatory Visit | Attending: Family Medicine | Admitting: Family Medicine

## 2021-09-04 DIAGNOSIS — R1084 Generalized abdominal pain: Secondary | ICD-10-CM

## 2021-09-04 MED ORDER — IOHEXOL 300 MG/ML  SOLN
100.0000 mL | Freq: Once | INTRAMUSCULAR | Status: AC | PRN
  Administered 2021-09-04: 100 mL via INTRAVENOUS

## 2021-09-04 MED ORDER — SODIUM CHLORIDE (PF) 0.9 % IJ SOLN
INTRAMUSCULAR | Status: AC
  Filled 2021-09-04: qty 50

## 2021-10-03 NOTE — Progress Notes (Signed)
Cardiology Office Note   Date:  10/06/2021   ID:  Brent Brock, DOB December 13, 1945, MRN [id number]  PCP:  Lemmie Evens, MD  Cardiologist:   Criss Bartles Martinique, MD   Chief Complaint  Patient presents with   Coronary Artery Disease      History of Present Illness: Brent Brock is a 76 y.o. male who presents for follow up CAD. Previously followed by Dr Dorris Carnes. He has with a history of a myocardial infarction in 2000 requiring percutaneous coronary intervention of the LAD ( [name]).  He also has a history of hypertension, hyperlipidemia, mild CV dz (USN in 2018) and a former history of tobacco abuse. He is a retired Pharmacist, hospital  On follow up today he is doing very well. No cardiac symptoms of chest pain, dyspnea, palpitations. 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 Followed by Dr Maureen Ralphs and TKR recommended. BP diary shows excellent BP control. No dizziness.    Past Medical History:  Diagnosis Date   Anxiety    ASCVD (arteriosclerotic cardiovascular disease)     PTCA of LAD in 03/1998; recath a few weeks later-no restenosis; apical MI in 04/1998; restenosis of the LAD-stent placed; rotational atherectomy of first diagonal   BPH (benign prostatic hyperplasia)    Chronic prostatitis    Diverticulosis    Elevated PSA    Enlarged prostate    GERD (gastroesophageal reflux disease)    History of coronary angioplasty    Hyperlipidemia    Hypertension    Nicotine dependence    Past use of tobacco    40 pack years discontinued in 03/1998   Seborrheic keratoses    Spondylolysis     Past Surgical History:  Procedure Laterality Date   COLONOSCOPY     CORONARY STENT PLACEMENT     x 2   POLYPECTOMY  1997   Polyps removed on vocal cords      Current Outpatient Medications  Medication Sig Dispense Refill   amLODipine (NORVASC) 5 MG tablet TAKE ONE TABLET ('5MG'$  TOTAL) BY MOUTH DAILY 90 tablet 1   aspirin EC 81 MG tablet Take 81 mg by mouth daily.      atorvastatin (LIPITOR) 40 MG tablet Take 1 tablet (40 mg total) by mouth daily. 90 tablet 3   clobetasol ointment (TEMOVATE) 5.63 % Apply 1 application. topically 2 (two) times daily. 30 g 0   clobetasol ointment (TEMOVATE) 8.75 % Apply 1 application. topically 2 (two) times daily. 60 g 0   esomeprazole (NEXIUM) 40 MG capsule Take 40 mg by mouth every other day.     finasteride (PROSCAR) 5 MG tablet Take 5 mg by mouth daily.     lisinopril-hydrochlorothiazide (ZESTORETIC) 20-12.5 MG tablet Take 1 tablet by mouth daily. 90 tablet 3   metoprolol succinate (TOPROL-XL) 50 MG 24 hr tablet TAKE ONE (1) TABLET BY MOUTH EVERY DAY 90 tablet 1   nitroGLYCERIN (NITROSTAT) 0.4 MG SL tablet Place 1 tablet (0.4 mg total) under the tongue every 5 (five) minutes as needed for chest pain. 25 tablet 3   No current facility-administered medications for this visit.    Allergies:   Yellow jacket venom [bee venom], Penicillins, and Sulfa antibiotics    Social History:  The patient  reports that he quit smoking about 23 years ago. His smoking use included pipe and cigarettes. He started smoking about 61 years ago. He has a 30.00 pack-year smoking history. He has never used  smokeless tobacco. He reports current alcohol use. He reports that he does not use drugs.   Family History:  The patient's family history includes Breast cancer in his daughter; CAD in an other family member; Heart attack in his father and mother; Heart disease in his father.    ROS:  Please see the history of present illness.   Otherwise, review of systems are positive for none.   All other systems are reviewed and negative.    PHYSICAL EXAM: VS:  BP 120/70 (BP Location: Left Arm, Patient Position: Sitting, Cuff Size: Normal)   Pulse 67   Resp 20   Ht '5\' 6"'$  (1.676 m)   Wt 171 lb 6.4 oz (77.7 kg)   SpO2 100%   BMI 27.66 kg/m  , BMI Body mass index is 27.66 kg/m. GEN: Well nourished, well developed, in no acute distress HEENT:  normal Neck: no JVD, carotid bruits, or masses Cardiac: RRR; no murmurs, rubs, or gallops,no edema  Respiratory:  clear to auscultation bilaterally, normal work of breathing GI: soft, nontender, nondistended, + BS MS: no deformity or atrophy Skin: warm and dry, no rash Neuro:  Strength and sensation are intact Psych: euthymic mood, full affect   EKG:  EKG is not ordered today. The ekg ordered today demonstrates N/A   Recent Labs: 11/02/2020: Creatinine, Ser 1.00    Lipid Panel    Component Value Date/Time   CHOL 134 07/27/2013 1401   TRIG 80 07/27/2013 1401   TRIG 163 05/19/2009 0000   HDL 41 07/27/2013 1401   CHOLHDL 3.3 07/27/2013 1401   VLDL 16 07/27/2013 1401   LDLCALC 77 07/27/2013 1401    Labs dated 06/14/21: cholesterol 130, triglycerides 91, LDL 75. HDL 38. BUN 27, creatinine 1.36. glucose 129- nonfasting. CBC normal.   Wt Readings from Last 3 Encounters:  10/06/21 171 lb 6.4 oz (77.7 kg)  03/14/21 177 lb (80.3 kg)  09/12/20 176 lb 6.4 oz (80 kg)      Other studies Reviewed: Additional studies/ records that were reviewed today include: none. Review of the above records demonstrates: N/A   ASSESSMENT AND PLAN:  1.  CAD:  Remote intervention in setting of MI in 2000 with POBA of LAD.    Remains asymptomatic. Continue ASA, statin, beta blocker and amlodipine   2. Essential hypertension:BP is very well controlled.  He denies dizziness     3  Hyperlipidemia  LDL 75. Ideally would like to see < 70. Will monitor. In review of records prior levels were OK. No on 20 mg lipitor.     4 CV dz  Mild plaquing of carotids several years ago   Keep on 81 ASA  and statin    Current medicines are reviewed at length with the patient today.  The patient does not have concerns regarding medicines.  The following changes have been made:  no change  Labs/ tests ordered today include:  No orders of the defined types were placed in this encounter.        Disposition:    FU with me in 1 year  Signed, Toryn Mcclinton Martinique, MD  10/06/2021 9:35 AM    [facility] [address] [place], [place], [address] Phone [phone], Fax [phone]

## 2021-10-06 ENCOUNTER — Encounter: Payer: Self-pay | Admitting: Cardiology

## 2021-10-06 ENCOUNTER — Ambulatory Visit: Payer: Medicare PPO | Admitting: Cardiology

## 2021-10-06 VITALS — BP 120/70 | HR 67 | Resp 20 | Ht 66.0 in | Wt 171.4 lb

## 2021-10-06 DIAGNOSIS — I2510 Atherosclerotic heart disease of native coronary artery without angina pectoris: Secondary | ICD-10-CM | POA: Diagnosis not present

## 2021-10-06 DIAGNOSIS — E785 Hyperlipidemia, unspecified: Secondary | ICD-10-CM

## 2021-10-06 DIAGNOSIS — I10 Essential (primary) hypertension: Secondary | ICD-10-CM | POA: Diagnosis not present

## 2022-04-08 IMAGING — DX DG CHEST 1V PORT
1 series · 1 of 1 positions shown · non-contrast
Comparison: Chest radiograph and CT chest 12/04/2013.

CLINICAL DATA: Cough, sore throat and nasal congestion.

EXAM:
PORTABLE CHEST 1 VIEW

[chest ap]
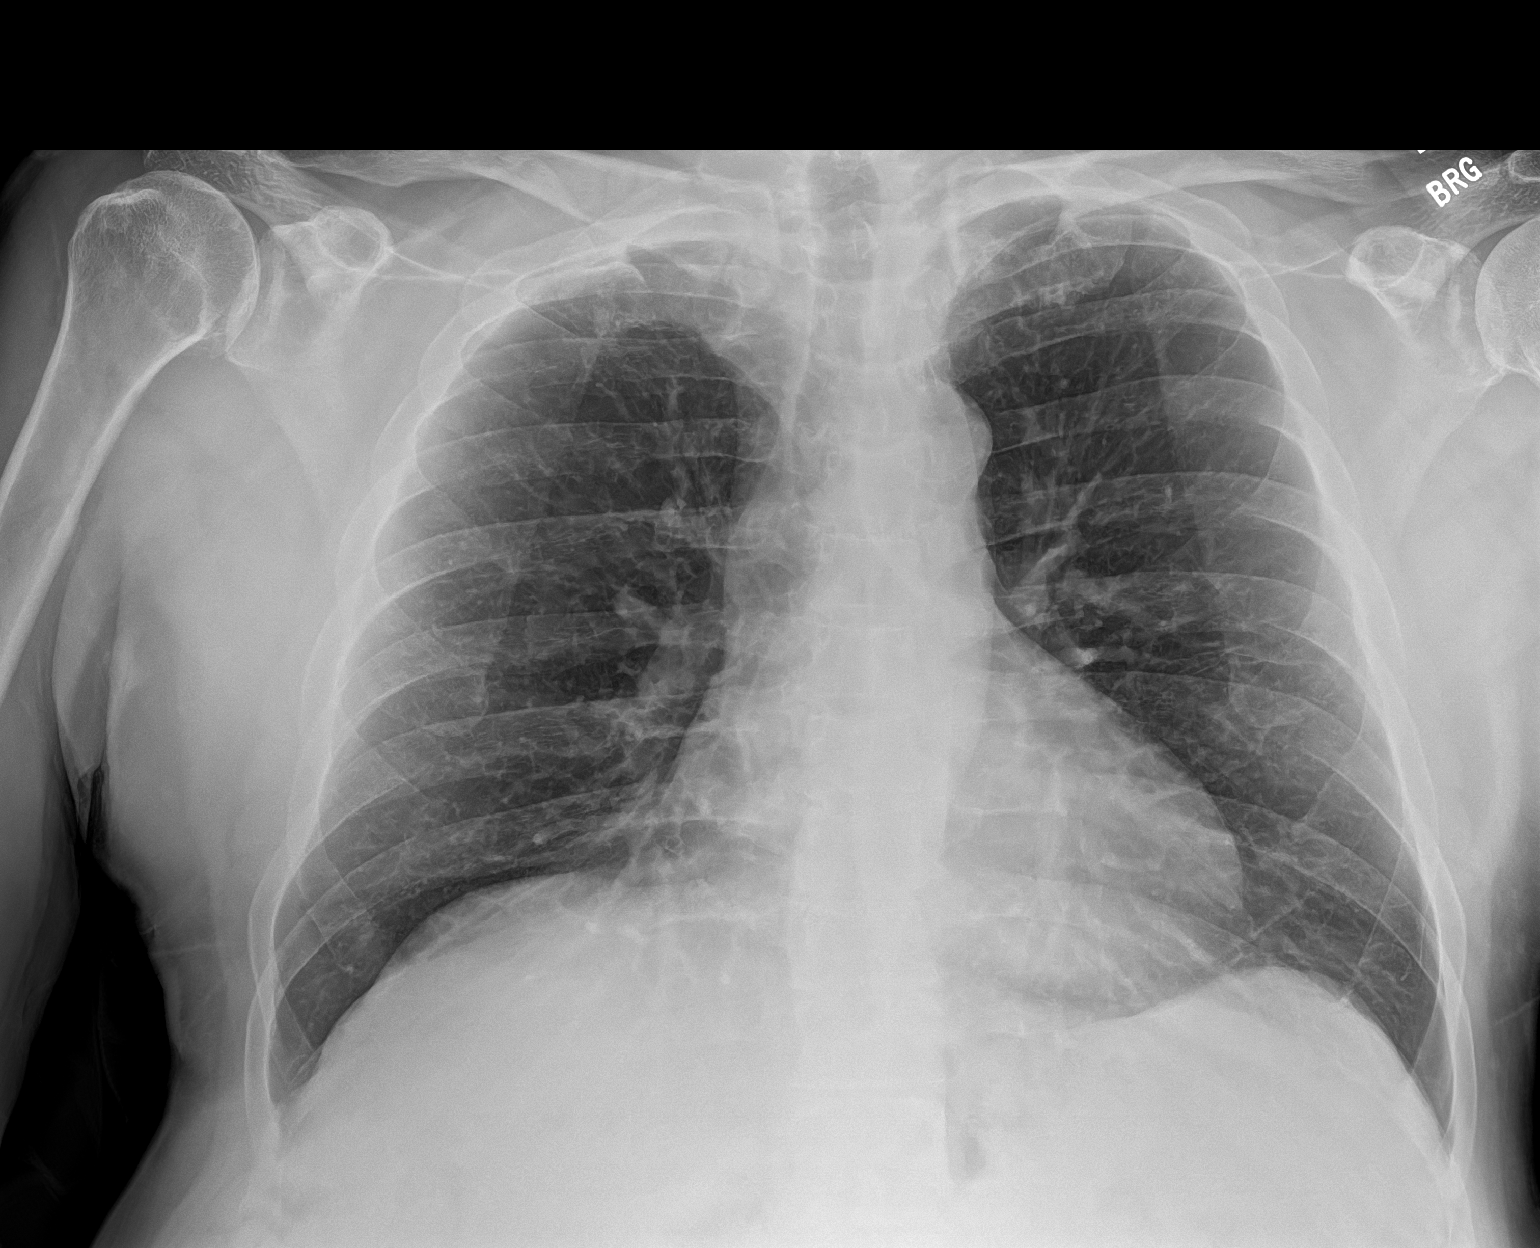

[1 of 1 positions shown; findings below may reference images not displayed]

FINDINGS: Trachea is midline. Heart size stable. Lungs are clear. No pleural
fluid.
IMPRESSION: No acute findings.

## 2022-04-23 NOTE — Progress Notes (Signed)
Cardiology Office Note:    Date:  04/24/2022   ID:  Brent Brock, DOB May 10, 1945, MRN [id number]  PCP:  Lemmie Evens, [place] Prov[facility] Cardiologist:  Peter Martinique, MD {   Referring MD: Lemmie Evens, MD   Chief Complaint  Patient presents with   Follow-up   Chest Pain   Hypertension    History of Present Illness:    Brent Brock is a 77 y.o. male with a hx of CAD, hypertension, hyperlipidemia, history of tobacco abuse, anxiety, BPH.  Patient is followed by Dr. [name] and presents today for evaluation of his blood pressure.  Chart review, patient has remote history of CAD.  Had a myocardial infarction in 2000 with POBA of LAD. He was previously followed by Dr. Bronson Ing. Last seen by Dr. Martinique on 10/06/21 for an office appointment. At that time, patient reported that he was doing well and denied chest pain, dyspnea, palpitations.   Today, patient reports that he is concerned because his blood pressure has been a little variable over the past month and a half. He checks and records his blood pressure at least twice a day. On my review of blood pressure log, BP predominantly ranges from 117-356 systolic. He also notices that he feels like "his chest and stomach are in his throat" at times and this causes discomfort in his chest and abdomen. Episodes are often associated with feelings of anxiety and occur while he is at rest. Reports that since he retired, he "sits around waiting for something bad to happen." Occasionally has left shoulder pain that usually only lasts for a second or two. Shoulder pain also occurs while he is at rest. Patient is fairly active in his day to day life. He does 21 push ups every morning, and does not get short of breath or develop chest pain. He also is able to walk around and walk up hills without chest pain, significant shortness of breath.      Past Medical History:  Diagnosis Date   Anxiety    ASCVD (arteriosclerotic  cardiovascular disease)     PTCA of LAD in 03/1998; recath a few weeks later-no restenosis; apical MI in 04/1998; restenosis of the LAD-stent placed; rotational atherectomy of first diagonal   BPH (benign prostatic hyperplasia)    Chronic prostatitis    Diverticulosis    Elevated PSA    Enlarged prostate    GERD (gastroesophageal reflux disease)    History of coronary angioplasty    Hyperlipidemia    Hypertension    Nicotine dependence    Past use of tobacco    40 pack years discontinued in 03/1998   Seborrheic keratoses    Spondylolysis     Past Surgical History:  Procedure Laterality Date   COLONOSCOPY     CORONARY STENT PLACEMENT     x 2   POLYPECTOMY  1997   Polyps removed on vocal cords     Current Medications: Current Meds  Medication Sig   amLODipine (NORVASC) 5 MG tablet TAKE ONE TABLET ('5MG'$  TOTAL) BY MOUTH DAILY   aspirin EC 81 MG tablet Take 81 mg by mouth daily.   atorvastatin (LIPITOR) 40 MG tablet Take 1 tablet (40 mg total) by mouth daily.   esomeprazole (NEXIUM) 40 MG capsule Take 40 mg by mouth every other day.   finasteride (PROSCAR) 5 MG tablet Take 5 mg by mouth daily.   lisinopril-hydrochlorothiazide (ZESTORETIC) 20-12.5 MG tablet Take 1 tablet by mouth daily.  metoprolol succinate (TOPROL-XL) 50 MG 24 hr tablet TAKE ONE (1) TABLET BY MOUTH EVERY DAY   nitroGLYCERIN (NITROSTAT) 0.4 MG SL tablet Place 1 tablet (0.4 mg total) under the tongue every 5 (five) minutes as needed for chest pain.   [DISCONTINUED] clobetasol ointment (TEMOVATE) 2.99 % Apply 1 application. topically 2 (two) times daily.   [DISCONTINUED] clobetasol ointment (TEMOVATE) 3.71 % Apply 1 application. topically 2 (two) times daily.     Allergies:   Yellow jacket venom [bee venom], Penicillins, and Sulfa antibiotics   Social History   Socioeconomic History   Marital status: Widowed    Spouse name: Not on file   Number of children: Not on file   Years of education: Not on file    Highest education level: Not on file  Occupational History   Occupation: Professor    Comment: [employer]  Tobacco Use   Smoking status: Former    Packs/day: 1.00    Years: 30.00    Total pack years: 30.00    Types: Pipe, Cigarettes    Start date: 03/26/1960    Quit date: 06/21/1998    Years since quitting: 23.8   Smokeless tobacco: Never  Vaping Use   Vaping Use: Never used  Substance and Sexual Activity   Alcohol use: Yes    Comment: Occas   Drug use: No   Sexual activity: Never  Other Topics Concern   Not on file  Social History Narrative   Lives w/ Wife   Lost only daughter from metastatic breast cancer    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 There is no history of Colon cancer.  ROS:   Please see the history of present illness.     All other systems reviewed and are negative.  EKGs/Labs/Other Studies Reviewed:    The following studies were reviewed today:   EKG:  EKG is ordered today.  The ekg ordered today demonstrates normal sinus rhythm, HR 84 BPM   Recent Labs: No results found for requested labs within last 365 days.  Recent Lipid Panel    Component Value Date/Time   CHOL 134 07/27/2013 1401   TRIG 80 07/27/2013 1401   TRIG 163 05/19/2009 0000   HDL 41 07/27/2013 1401   CHOLHDL 3.3 07/27/2013 1401   VLDL 16 07/27/2013 1401   LDLCALC 77 07/27/2013 1401     Risk Assessment/Calculations:      Physical Exam:    VS:  BP (!) 114/90 (BP Location: Left Arm, Patient Position: Sitting, Cuff Size: Normal)   Pulse 84   Ht '5\' 6"'$  (1.676  m)   Wt 174 lb (78.9 kg)   BMI 28.08 kg/m     Wt Readings from Last 3 Encounters:  04/24/22 174 lb (78.9 kg)  10/06/21 171 lb 6.4 oz (77.7 kg)  03/14/21 177 lb (80.3 kg)     GEN: Well nourished, well developed in no acute distress. Sitting comfortably on the exam table  HEENT: Normal NECK: No JVD with head elevated to 30 degrees; No carotid bruits CARDIAC: RRR,  no murmurs, rubs, gallops. Radial pulses 2+ bilaterally  RESPIRATORY:  Clear to auscultation without rales, wheezing or rhonchi. Normal WOB on room air  ABDOMEN: Soft, non-tender, non-distended MUSCULOSKELETAL:  No edema in BLE; No deformity  SKIN: Warm and dry NEUROLOGIC:  Alert and oriented x 3 PSYCHIATRIC:  Normal affect   ASSESSMENT:    1. Coronary artery disease involving native coronary artery of native heart without angina pectoris   2. Abdominal discomfort   3. Essential hypertension   4. Hyperlipidemia LDL goal <70    PLAN:    In order of problems listed above:  CAD  Abdominal Discomfort  - Patient has a remote history of intervention in the setting of MI in 2000- underwent POBA of the LAD  - Today, patient reports that he occasionally has brief episodes of abdominal pain. Feels like his "stomach is in his throat". Episodes only last a minute or so each and resolve on their own. He has tried antacids which improved symptoms. He also has noted that abdominal discomfort occurs when he is anxious. Suspect symptoms are either GI or related to anxiety. Encouraged patient to follow up with PCP for further workup. Patient denies any left sided chest pain, chest pain/abdominal pain/left shoulder pain when exercising. Symptoms are no convincing for cardiac etiology, no need for further cardiac workup at this time  - Continue ASA, lipitor 40 mg daily, metoprolol succinate 50 mg daily   HTN  - Patient brought a blood pressure log to clinic today that showed his BP was very well controlled. Predominantly between 878-676  systolic, 72C-94B diastolic  - In clinic, BP 114/90. Patient admits that he is anxious today  - Denies dizziness, lightheadedness.  - Continue amlodipine 5 mg daily, lisinopril-hydrochlorothiazide 25/12.5 mg daily, metoprolol succinate 50 mg daily  - Ordered BMP to assess renal function and electrolytes on lisinopril-HCTZ   HLD  - Goal LDL <70  - Managed by PCP- patient reports getting labs drawn with PCP every 6 months  - Continue lipitor 40 mg daily    Medication Adjustments/Labs and Tests Ordered: Current medicines are reviewed at length with the patient today.  Concerns regarding medicines are outlined above.  Orders Placed This Encounter  Procedures   Basic metabolic panel   EKG [id number]   No orders of the defined types were placed in this encounter.   Patient Instructions  Medication Instructions:   Your physician recommends that you continue on your current medications as directed. Please refer to the Current Medication list given to you today.  *If you need a refill on your cardiac medications before your next appointment, please call your pharmacy*  Lab Work: Your physician recommends that you return for lab work TODAY:  BMP  If you have labs (blood work) drawn today and your tests are completely normal, you will receive your results only by: [place] (if you have [place]) OR A paper copy in the mail If you have any lab test that is abnormal or we need to change your treatment, we will call you to review the results.   Testing/Procedures: NONE ordered at this time of appointment   Follow-Up: At [facility], you and your health needs are our priority.  As part of our continuing mission to provide you with exceptional heart care, we have created designated Provider Care Teams.  These Care Teams include your primary Cardiologist (physician) and Advanced Practice Providers (APPs -  Physician Assistants and Nurse Practitioners) who all work together to  provide you with the care you need, when you need it.  We recommend signing up for the patient portal called "MyChart".  Sign up information is provided on this After Visit Summary.  MyChart is used to connect with patients for Virtual Visits (Telemedicine).  Patients are able to view lab/test results, encounter notes, upcoming appointments, etc.  Non-urgent messages can be sent to your provider as well.   To learn more about what you can do with MyChart, go to [email or web address].    Your next appointment:   As previously scheduled    Provider:   Peter Martinique, MD     Other Instructions     Signed, Margie Billet, PA-C  04/24/2022 5:07 PM    [place]

## 2022-04-24 ENCOUNTER — Encounter: Payer: Self-pay | Admitting: Student

## 2022-04-24 ENCOUNTER — Ambulatory Visit: Payer: Medicare PPO | Attending: Student | Admitting: Cardiology

## 2022-04-24 VITALS — BP 114/90 | HR 84 | Ht 66.0 in | Wt 174.0 lb

## 2022-04-24 DIAGNOSIS — I2510 Atherosclerotic heart disease of native coronary artery without angina pectoris: Secondary | ICD-10-CM

## 2022-04-24 DIAGNOSIS — I10 Essential (primary) hypertension: Secondary | ICD-10-CM

## 2022-04-24 DIAGNOSIS — R109 Unspecified abdominal pain: Secondary | ICD-10-CM

## 2022-04-24 DIAGNOSIS — E785 Hyperlipidemia, unspecified: Secondary | ICD-10-CM | POA: Diagnosis not present

## 2022-04-24 NOTE — Patient Instructions (Addendum)
Medication Instructions:   Your physician recommends that you continue on your current medications as directed. Please refer to the Current Medication list given to you today.  *If you need a refill on your cardiac medications before your next appointment, please call your pharmacy*  Lab Work: Your physician recommends that you return for lab work TODAY:  BMP  If you have labs (blood work) drawn today and your tests are completely normal, you will receive your results only by: [place] (if you have [place]) OR A paper copy in the mail If you have any lab test that is abnormal or we need to change your treatment, we will call you to review the results.   Testing/Procedures: NONE ordered at this time of appointment   Follow-Up: At [facility], you and your health needs are our priority.  As part of our continuing mission to provide you with exceptional heart care, we have created designated Provider Care Teams.  These Care Teams include your primary Cardiologist (physician) and Advanced Practice Providers (APPs -  Physician Assistants and Nurse Practitioners) who all work together to provide you with the care you need, when you need it.  We recommend signing up for the patient portal called "MyChart".  Sign up information is provided on this After Visit Summary.  MyChart is used to connect with patients for Virtual Visits (Telemedicine).  Patients are able to view lab/test results, encounter notes, upcoming appointments, etc.  Non-urgent messages can be sent to your provider as well.   To learn more about what you can do with MyChart, go to [email or web address].    Your next appointment:   As previously scheduled    Provider:   Peter Martinique, MD     Other Instructions

## 2022-04-25 LAB — BASIC METABOLIC PANEL
BUN/Creatinine Ratio: 12 (ref 10–24)
BUN: 14 mg/dL (ref 8–27)
CO2: 21 mmol/L (ref 20–29)
Calcium: 9.9 mg/dL (ref 8.6–10.2)
Chloride: 103 mmol/L (ref 96–106)
Creatinine, Ser: 1.18 mg/dL (ref 0.76–1.27)
Glucose: 106 mg/dL — ABNORMAL HIGH (ref 70–99)
Potassium: 4.2 mmol/L (ref 3.5–5.2)
Sodium: 142 mmol/L (ref 134–144)
eGFR: 64 mL/min/{1.73_m2} (ref >59)

## 2022-05-16 ENCOUNTER — Other Ambulatory Visit (HOSPITAL_COMMUNITY): Payer: Self-pay | Admitting: Family Medicine

## 2022-05-16 ENCOUNTER — Ambulatory Visit (HOSPITAL_COMMUNITY)
Admission: RE | Admit: 2022-05-16 | Discharge: 2022-05-16 | Disposition: A | Discharge status: Home / Self Care | Payer: Medicare PPO | Source: Ambulatory Visit | Attending: Family Medicine | Admitting: Family Medicine

## 2022-05-16 DIAGNOSIS — R1010 Upper abdominal pain, unspecified: Secondary | ICD-10-CM | POA: Diagnosis not present

## 2022-05-18 LAB — LAB REPORT - SCANNED
A1c: 6
EGFR: 56

## 2022-08-06 ENCOUNTER — Ambulatory Visit: Payer: Medicare PPO | Admitting: Internal Medicine

## 2022-08-06 ENCOUNTER — Encounter: Payer: Self-pay | Admitting: Internal Medicine

## 2022-08-06 VITALS — BP 113/68 | HR 65 | Ht 67.0 in | Wt 172.2 lb

## 2022-08-06 DIAGNOSIS — R7989 Other specified abnormal findings of blood chemistry: Secondary | ICD-10-CM | POA: Insufficient documentation

## 2022-08-06 DIAGNOSIS — R7303 Prediabetes: Secondary | ICD-10-CM | POA: Insufficient documentation

## 2022-08-06 DIAGNOSIS — N400 Enlarged prostate without lower urinary tract symptoms: Secondary | ICD-10-CM

## 2022-08-06 DIAGNOSIS — I2510 Atherosclerotic heart disease of native coronary artery without angina pectoris: Secondary | ICD-10-CM | POA: Diagnosis not present

## 2022-08-06 DIAGNOSIS — R944 Abnormal results of kidney function studies: Secondary | ICD-10-CM | POA: Insufficient documentation

## 2022-08-06 DIAGNOSIS — K219 Gastro-esophageal reflux disease without esophagitis: Secondary | ICD-10-CM

## 2022-08-06 DIAGNOSIS — I10 Essential (primary) hypertension: Secondary | ICD-10-CM | POA: Diagnosis not present

## 2022-08-06 NOTE — Assessment & Plan Note (Signed)
Followed by urology ([facility]-Dr. Laverle Patter).  He is prescribed finasteride 5 mg daily.  Asymptomatic currently. -No medication changes today

## 2022-08-06 NOTE — Assessment & Plan Note (Signed)
GFR 50s on labs from February.  Previously 64 on BMP from January.  No prior history of CKD. -Repeat BMP and urine microalbumin/creatinine ratio ordered today

## 2022-08-06 NOTE — Patient Instructions (Signed)
It was a pleasure to see you today.  Thank you for giving [place] the opportunity to be involved in your care.  Below is a brief recap of your visit and next steps.  We will plan to see you again in 3 months.  Summary You have established care today.  We will repeat labs and check a urine study Follow up in 3 months.

## 2022-08-06 NOTE — Assessment & Plan Note (Signed)
Symptoms are currently well-controlled with Nexium.   -No medication changes today.

## 2022-08-06 NOTE — Assessment & Plan Note (Signed)
TSH 6.2 on labs from February.  No T4 available, but per patient seems as though subclinical hypothyroidism was previously discussed.  He is asymptomatic currently. -Repeat thyroid studies at follow-up in 3 months

## 2022-08-06 NOTE — Assessment & Plan Note (Signed)
A1c 6.0 on labs from February.  He endorses that there is room for significant improvement from a dietary standpoint and has attempted to make changes. -Repeat A1c at follow-up in 3 months

## 2022-08-06 NOTE — Assessment & Plan Note (Signed)
History of CAD s/p POBA to LAD in 2000.  Followed by cardiology (Dr. [place]).  Last seen for follow-up in January.  He is currently prescribed ASA 81 mg daily, atorvastatin 40 mg daily, and Toprol-XL 50 mg daily.  Denies recent chest pain. -No medication changes today

## 2022-08-06 NOTE — Progress Notes (Signed)
New Patient Office Visit  Subjective    Patient ID: SERGEY CRAIGEN, male    DOB: 04-20-1945  Age: 77 y.o. MRN: [id number]  CC:  Chief Complaint  Patient presents with   Establish Care    HPI APOLINAR GUIRGUIS presents to establish care.  He is a 77 year old male with a past medical history significant for CAD, HTN, prediabetes, BPH, GERD, bilateral knee osteoarthritis, and a history of diverticulitis.  He was previously followed by Dr. Sudie Bailey.  Mr. Andreski reports feeling well today.  He is asymptomatic and has no acute concerns to discuss aside from desiring to establish care.  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  He endorses former tobacco use, quitting in 1990.  He denies alcohol and illicit drug use.  His family medical history is significant for early CAD, diabetes mellitus, and multiple cancers.  Chronic medical conditions and outstanding preventative care items discussed today are individually addressed A/P below.   Outpatient Encounter Medications as of 08/06/2022  Medication Sig   amLODipine (NORVASC) 5 MG tablet TAKE ONE TABLET (5MG  TOTAL) BY MOUTH DAILY   aspirin EC 81 MG tablet Take 81 mg by mouth daily.   atorvastatin (LIPITOR) 40 MG tablet Take 1 tablet (40 mg total) by mouth daily.   esomeprazole (NEXIUM) 40 MG capsule Take 40 mg by mouth every other day.   finasteride (PROSCAR) 5 MG tablet Take 5 mg by mouth daily.   lisinopril-hydrochlorothiazide (ZESTORETIC) 20-12.5 MG tablet Take 1 tablet by mouth daily.   metoprolol succinate (TOPROL-XL) 50 MG 24 hr tablet TAKE ONE (1) TABLET BY MOUTH EVERY DAY   nitroGLYCERIN (NITROSTAT) 0.4 MG SL tablet Place 1 tablet (0.4 mg total) under the tongue every 5 (five) minutes as needed for chest pain.   No facility-administered encounter medications on file as of 08/06/2022.    Past Medical History:  Diagnosis Date   Anxiety    ASCVD (arteriosclerotic cardiovascular disease)     PTCA of LAD in 03/1998; recath a  few weeks later-no restenosis; apical MI in 04/1998; restenosis of the LAD-stent placed; rotational atherectomy of first diagonal   BPH (benign prostatic hyperplasia)    Chronic prostatitis    Diverticulosis    Elevated PSA    Enlarged prostate    GERD (gastroesophageal reflux disease)    History of coronary angioplasty    Hyperlipidemia    Hypertension    Nicotine dependence    Past use of tobacco    40 pack years discontinued in 03/1998   Seborrheic keratoses    Spondylolysis     Past Surgical History:  Procedure Laterality Date   COLONOSCOPY     CORONARY STENT PLACEMENT     x 2   POLYPECTOMY  1997   Polyps removed on vocal cords     Family History  Problem Relation Age of Onset   Heart attack Mother    Heart attack Father    Heart disease Father    Breast cancer Daughter        age 22    CAD Other    Colon cancer Neg Hx     Social History   Socioeconomic History   Marital status: Widowed    Spouse name: Not on file   Number of children: Not on file   Years of education: Not on file   Highest education level: Not on file  Occupational History   Occupation: Professor    Comment: [employer]  Tobacco Use   Smoking status: Former    Packs/day: 1.00    Years: 30.00    Additional pack years: 0.00    Total pack years: 30.00    Types: Pipe, Cigarettes    Start date: 03/26/1960    Quit date: 06/21/1998    Years since quitting: 24.1   Smokeless tobacco: Never  Vaping Use   Vaping Use: Never used  Substance and Sexual Activity   Alcohol use: Yes    Comment: Occas   Drug use: No   Sexual activity: Never  Other Topics Concern   Not on file  Social History Narrative   Lives w/ Wife   Lost only daughter from metastatic breast cancer    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  HENT:  Negative for sore throat.   Respiratory:  Negative for cough and shortness of breath.   Cardiovascular:  Negative for chest pain, palpitations and leg swelling.  Gastrointestinal:  Negative for abdominal pain, blood in stool, constipation, diarrhea, nausea and vomiting.  Genitourinary:  Negative for dysuria and hematuria.  Musculoskeletal:  Negative for myalgias.  Skin:  Negative for itching and rash.  Neurological:  Negative for dizziness and headaches.  Psychiatric/Behavioral:  Negative for depression and suicidal ideas.     Objective    BP 113/68   Pulse 65   Ht 5\' 7"  (1.702 m)   Wt 172 lb 3.2 oz (78.1 kg)   SpO2 96%   BMI 26.97 kg/m   Physical Exam Vitals reviewed.  Constitutional:      General: He is not in acute distress.    Appearance: Normal appearance. He is not ill-appearing.  HENT:     Head: Normocephalic and atraumatic.     Right Ear: External ear normal.     Left Ear: External ear normal.     Nose: Nose normal. No congestion or rhinorrhea.     Mouth/Throat:     Mouth: Mucous membranes are moist.     Pharynx: Oropharynx is clear.  Eyes:     General: No scleral icterus.    Extraocular Movements: Extraocular movements intact.     Conjunctiva/sclera: Conjunctivae normal.     Pupils: Pupils are equal, round, and reactive to light.  Cardiovascular:     Rate and Rhythm: Normal rate and regular rhythm.     Pulses: Normal pulses.     Heart sounds: Normal heart sounds. No murmur heard. Pulmonary:     Effort: Pulmonary effort is normal.     Breath sounds: Normal breath sounds. No wheezing, rhonchi or rales.  Abdominal:     General: Abdomen is flat. Bowel sounds are normal. There is no distension.     Palpations: Abdomen is soft.     Tenderness: There is no abdominal tenderness.  Musculoskeletal:         General: No swelling or deformity. Normal range of motion.     Cervical back: Normal range of motion.  Skin:    General: Skin is warm and dry.     Capillary Refill: Capillary refill takes less than 2 seconds.  Neurological:     General: No  focal deficit present.     Mental Status: He is alert and oriented to person, place, and time.     Motor: No weakness.  Psychiatric:        Mood and Affect: Mood normal.        Behavior: Behavior normal.        Thought Content: Thought content normal.     Last CBC Lab Results  Component Value Date   WBC 7.8 12/04/2013   HGB 14.8 12/04/2013   HCT 41.5 12/04/2013   MCV 84.7 12/04/2013   MCH 30.2 12/04/2013   RDW 14.0 12/04/2013   PLT 148 (L) 12/04/2013   Last metabolic panel Lab Results  Component Value Date   GLUCOSE 106 (H) 04/24/2022   NA 142 04/24/2022   K 4.2 04/24/2022   CL 103 04/24/2022   CO2 21 04/24/2022   BUN 14 04/24/2022   CREATININE 1.18 04/24/2022   EGFR 64 04/24/2022   CALCIUM 9.9 04/24/2022   PROT 6.7 07/27/2013   ALBUMIN 4.2 07/27/2013   BILITOT 0.9 07/27/2013   ALKPHOS 88 07/27/2013   AST 22 07/27/2013   ALT 22 07/27/2013   ANIONGAP 16 (H) 12/04/2013   Last lipids Lab Results  Component Value Date   CHOL 134 07/27/2013   HDL 41 07/27/2013   LDLCALC 77 07/27/2013   TRIG 80 07/27/2013   CHOLHDL 3.3 07/27/2013   Last thyroid functions Lab Results  Component Value Date   TSH 4.42 11/12/2011   Assessment & Plan:   Problem List Items Addressed This Visit       Hypertension    Currently prescribed amlodipine 5 mg daily, lisinopril-HCTZ 20-12.5 mg daily, and metoprolol succinate 50 mg daily for treatment of hypertension.  BP today is 113/68. -No medication changes today      Arteriosclerotic cardiovascular disease (ASCVD)    History of CAD s/p POBA to LAD in 2000.  Followed by cardiology (Dr. [place]).  Last seen for follow-up in January.  He is currently prescribed ASA 81 mg daily, atorvastatin  40 mg daily, and Toprol-XL 50 mg daily.  Denies recent chest pain. -No medication changes today      Gastroesophageal reflux    Symptoms are currently well-controlled with Nexium.   -No medication changes today.      BPH (benign prostatic hyperplasia)    Followed by urology ([facility]-Dr. Laverle Patter).  He is prescribed finasteride 5 mg daily.  Asymptomatic currently. -No medication changes today      Elevated TSH    TSH 6.2 on labs from February.  No T4 available, but per patient seems as though subclinical hypothyroidism was previously discussed.  He is asymptomatic currently. -Repeat thyroid studies at follow-up in 3 months      Prediabetes    A1c 6.0 on labs from February.  He endorses that there is room for significant improvement from a dietary standpoint and has attempted to make changes. -Repeat A1c at follow-up in 3 months      Decreased GFR    GFR 50s on labs from February.  Previously 64 on BMP from January.  No prior history of CKD. -Repeat BMP and urine microalbumin/creatinine ratio ordered today      Return in about 3 months (around 11/06/2022).   Billie Lade, MD

## 2022-08-06 NOTE — Assessment & Plan Note (Signed)
Currently prescribed amlodipine 5 mg daily, lisinopril-HCTZ 20-12.5 mg daily, and metoprolol succinate 50 mg daily for treatment of hypertension.  BP today is 113/68. -No medication changes today

## 2022-08-08 LAB — MICROALBUMIN / CREATININE URINE RATIO
Creatinine, Urine: 46.3 mg/dL
Microalb/Creat Ratio: <6 mg/g{creat} (ref 0–29)
Microalbumin, Urine: <3 ug/mL

## 2022-08-08 LAB — BASIC METABOLIC PANEL WITH GFR
BUN/Creatinine Ratio: 13 (ref 10–24)
BUN: 14 mg/dL (ref 8–27)
CO2: 24 mmol/L (ref 20–29)
Calcium: 9.5 mg/dL (ref 8.6–10.2)
Chloride: 105 mmol/L (ref 96–106)
Creatinine, Ser: 1.11 mg/dL (ref 0.76–1.27)
Glucose: 96 mg/dL (ref 70–99)
Potassium: 3.8 mmol/L (ref 3.5–5.2)
Sodium: 144 mmol/L (ref 134–144)
eGFR: 69 mL/min/{1.73_m2}

## 2022-10-16 NOTE — Progress Notes (Unsigned)
Cardiology Office Note   Date:  10/18/2022   ID:  Brent Brock, DOB [birth year], MRN [id number]  PCP:  Brent Lade, MD  Cardiologist:   Brent Bolz [place], MD   Chief Complaint  Patient presents with   Coronary Artery Disease      History of Present Illness: Brent Brock is a 77 y.o. male who presents for follow up CAD.  He has with a history of a myocardial infarction in 2000 requiring percutaneous coronary intervention of the LAD ( [name]).  He also has a history of hypertension, hyperlipidemia,  and a former history of tobacco abuse. He is a retired Runner, broadcasting/film/video.   On follow up today he is doing very well. No cardiac symptoms of chest pain, dyspnea, palpitations. 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 Has been hesitant to have knee surgery. BP diary shows excellent BP control. No dizziness. He feels he is doing well.    Past Medical History:  Diagnosis Date   Anxiety    ASCVD (arteriosclerotic cardiovascular disease)     PTCA of LAD in 03/1998; recath a few weeks later-no restenosis; apical MI in 04/1998; restenosis of the LAD-stent placed; rotational atherectomy of first diagonal   BPH (benign prostatic hyperplasia)    Chronic prostatitis    Diverticulosis    Elevated PSA    Enlarged prostate    GERD (gastroesophageal reflux disease)    History of coronary angioplasty    Hyperlipidemia    Hypertension    Nicotine dependence    Past use of tobacco    40 pack years discontinued in 03/1998   Seborrheic keratoses    Spondylolysis     Past Surgical History:  Procedure Laterality Date   COLONOSCOPY     CORONARY STENT PLACEMENT     x 2   POLYPECTOMY  1997   Polyps removed on vocal cords      Current Outpatient Medications  Medication Sig Dispense Refill   aspirin EC 81 MG tablet Take 81 mg by mouth daily.     esomeprazole (NEXIUM) 40 MG capsule Take 40 mg by mouth every other day.     finasteride (PROSCAR) 5 MG tablet Take 5 mg by  mouth daily.     amLODipine (NORVASC) 5 MG tablet Take 1 tablet (5 mg total) by mouth daily. 90 tablet 3   atorvastatin (LIPITOR) 40 MG tablet Take 1 tablet (40 mg total) by mouth daily. 90 tablet 3   lisinopril-hydrochlorothiazide (ZESTORETIC) 20-12.5 MG tablet Take 1 tablet by mouth daily. 90 tablet 3   metoprolol succinate (TOPROL-XL) 50 MG 24 hr tablet TAKE ONE (1) TABLET BY MOUTH EVERY DAY 90 tablet 3   nitroGLYCERIN (NITROSTAT) 0.4 MG SL tablet Place 1 tablet (0.4 mg total) under the tongue every 5 (five) minutes as needed for chest pain. 25 tablet 3   No current facility-administered medications for this visit.    Allergies:   Pneumovax 23 [pneumococcal vac polyvalent], Yellow jacket venom [bee venom], Penicillins, and Sulfa antibiotics    Social History:  The patient  reports that he quit smoking about 24 years ago. His smoking use included pipe and cigarettes. He started smoking about 62 years ago. He has a 38.2 pack-year smoking history. He has never used smokeless tobacco. He reports current alcohol use. He reports that he does not use drugs.   Family History:  The patient's family history includes Breast cancer in his daughter; CAD in  an other family member; Heart attack in his father and mother; Heart disease in his father.    ROS:  Please see the history of present illness.   Otherwise, review of systems are positive for none.   All other systems are reviewed and negative.    PHYSICAL EXAM: VS:  BP 102/68   Pulse 84   Ht 5\' 7"  (1.702 m)   Wt 173 lb 3.2 oz (78.6 kg)   SpO2 98%   BMI 27.13 kg/m  , BMI Body mass index is 27.13 kg/m. GEN: Well nourished, well developed, in no acute distress HEENT: normal Neck: no JVD, carotid bruits, or masses Cardiac: RRR; no murmurs, rubs, or gallops,no edema  Respiratory:  clear to auscultation bilaterally, normal work of breathing GI: soft, nontender, nondistended, + BS MS: no deformity or atrophy Skin: warm and dry, no rash Neuro:   Strength and sensation are intact Psych: euthymic mood, full affect   EKG:  EKG is not ordered today. The ekg ordered today demonstrates N/A   Recent Labs: 08/06/2022: BUN 14; Creatinine, Ser 1.11; Potassium 3.8; Sodium 144    Lipid Panel    Component Value Date/Time   CHOL 134 07/27/2013 1401   TRIG 80 07/27/2013 1401   TRIG 163 05/19/2009 0000   HDL 41 07/27/2013 1401   CHOLHDL 3.3 07/27/2013 1401   VLDL 16 07/27/2013 1401   LDLCALC 77 07/27/2013 1401    Labs dated 06/14/21: cholesterol 130, triglycerides 91, LDL 75. HDL 38. BUN 27, creatinine 1.36. glucose 129- nonfasting. CBC normal.  Dated 05/17/22: cholesterol 124, triglycerides 134, LDL 59. HDL 43. Glucose 118. A1c 6%. Creatinine 1.23. otherwise CMET normal. TSH 6.12. CBC normal.   Wt Readings from Last 3 Encounters:  10/18/22 173 lb 3.2 oz (78.6 kg)  08/06/22 172 lb 3.2 oz (78.1 kg)  04/24/22 174 lb (78.9 kg)      Other studies Reviewed: Additional studies/ records that were reviewed today include: none. Review of the above records demonstrates: N/A   ASSESSMENT AND PLAN:  1.  CAD:  Remote intervention in setting of MI in 2000 with POBA of LAD.    Remains asymptomatic. Continue ASA, statin, beta blocker and amlodipine   2. Essential hypertension:BP is very well controlled.  He denies dizziness. If he should develop dizziness I would stop HCT    3  Hyperlipidemia  LDL 59. At goal      Current medicines are reviewed at length with the patient today.  The patient does not have concerns regarding medicines.  The following changes have been made:  no change  Labs/ tests ordered today include:  No orders of the defined types were placed in this encounter.        Disposition:   FU with me in 6 months  Signed, Brent Carline [place], MD  10/18/2022 10:12 AM    [facility] [address], [place], [place], [address] Phone [phone], Fax [phone]

## 2022-10-18 ENCOUNTER — Ambulatory Visit: Payer: Medicare PPO | Attending: Cardiology | Admitting: Cardiology

## 2022-10-18 ENCOUNTER — Encounter: Payer: Self-pay | Admitting: Cardiology

## 2022-10-18 VITALS — BP 102/68 | HR 84 | Ht 67.0 in | Wt 173.2 lb

## 2022-10-18 DIAGNOSIS — I2510 Atherosclerotic heart disease of native coronary artery without angina pectoris: Secondary | ICD-10-CM | POA: Diagnosis not present

## 2022-10-18 DIAGNOSIS — I10 Essential (primary) hypertension: Secondary | ICD-10-CM

## 2022-10-18 DIAGNOSIS — E785 Hyperlipidemia, unspecified: Secondary | ICD-10-CM

## 2022-10-18 MED ORDER — LISINOPRIL-HYDROCHLOROTHIAZIDE 20-12.5 MG PO TABS: 1.0000 | ORAL_TABLET | Freq: Every day | ORAL | 3 refills | Status: DC

## 2022-10-18 MED ORDER — AMLODIPINE BESYLATE 5 MG PO TABS: 5.0000 mg | ORAL_TABLET | Freq: Every day | ORAL | 3 refills | Status: DC

## 2022-10-18 MED ORDER — METOPROLOL SUCCINATE ER 50 MG PO TB24: ORAL_TABLET | ORAL | 3 refills | Status: DC

## 2022-10-18 MED ORDER — ATORVASTATIN CALCIUM 40 MG PO TABS: 40.0000 mg | ORAL_TABLET | Freq: Every day | ORAL | 3 refills | Status: DC

## 2022-10-18 MED ORDER — NITROGLYCERIN 0.4 MG SL SUBL: 0.4000 mg | SUBLINGUAL_TABLET | SUBLINGUAL | 3 refills | Status: AC | PRN

## 2022-10-18 NOTE — Patient Instructions (Signed)
Medication Instructions:  Your physician recommends that you continue on your current medications as directed. Please refer to the Current Medication list given to you today.  *If you need a refill on your cardiac medications before your next appointment, please call your pharmacy*   Lab Work: None needed  If you have labs (blood work) drawn today and your tests are completely normal, you will receive your results only by: MyChart Message (if you have MyChart) OR A paper copy in the mail If you have any lab test that is abnormal or we need to change your treatment, we will call you to review the results.    Follow-Up: At [facility], you and your health needs are our priority.  As part of our continuing mission to provide you with exceptional heart care, we have created designated Provider Care Teams.  These Care Teams include your primary Cardiologist (physician) and Advanced Practice Providers (APPs -  Physician Assistants and Nurse Practitioners) who all work together to provide you with the care you need, when you need it.  We recommend signing up for the patient portal called "MyChart".  Sign up information is provided on this After Visit Summary.  MyChart is used to connect with patients for Virtual Visits (Telemedicine).  Patients are able to view lab/test results, encounter notes, upcoming appointments, etc.  Non-urgent messages can be sent to your provider as well.   To learn more about what you can do with MyChart, go to [email or web address].    Your next appointment:   6 month(s)  Provider:   Peter [place], MD

## 2022-10-24 ENCOUNTER — Other Ambulatory Visit: Payer: Self-pay | Admitting: Cardiology

## 2022-11-06 ENCOUNTER — Ambulatory Visit: Payer: Medicare PPO | Admitting: Internal Medicine

## 2022-11-06 ENCOUNTER — Encounter: Payer: Self-pay | Admitting: Internal Medicine

## 2022-11-06 VITALS — BP 105/69 | HR 75 | Ht 67.0 in | Wt 175.6 lb

## 2022-11-06 DIAGNOSIS — I2510 Atherosclerotic heart disease of native coronary artery without angina pectoris: Secondary | ICD-10-CM

## 2022-11-06 DIAGNOSIS — Z1159 Encounter for screening for other viral diseases: Secondary | ICD-10-CM

## 2022-11-06 DIAGNOSIS — I10 Essential (primary) hypertension: Secondary | ICD-10-CM | POA: Diagnosis not present

## 2022-11-06 DIAGNOSIS — N400 Enlarged prostate without lower urinary tract symptoms: Secondary | ICD-10-CM

## 2022-11-06 DIAGNOSIS — R7989 Other specified abnormal findings of blood chemistry: Secondary | ICD-10-CM

## 2022-11-06 DIAGNOSIS — E782 Mixed hyperlipidemia: Secondary | ICD-10-CM

## 2022-11-06 DIAGNOSIS — R7303 Prediabetes: Secondary | ICD-10-CM | POA: Diagnosis not present

## 2022-11-06 DIAGNOSIS — K219 Gastro-esophageal reflux disease without esophagitis: Secondary | ICD-10-CM

## 2022-11-06 NOTE — Progress Notes (Signed)
Established Patient Office Visit  Subjective   Patient ID: Brent Brock, male    DOB: Jul 30, 1945  Age: 77 y.o. MRN: [id number]  Chief Complaint  Patient presents with   Prediabetes    Follow up   Brent Brock returns to care today for routine follow-up.  He was last evaluated by me on 5/13 as a new patient presenting to establish care.  No medication changes were made at that time and repeat labs were ordered.  In the interim he has been seen by cardiology for routine follow-up.  There have otherwise been no acute interval events. Brent Brock reports feeling well today.  He is asymptomatic currently and has no acute concerns to discuss.  Past Medical History:  Diagnosis Date   Anxiety    ASCVD (arteriosclerotic cardiovascular disease)     PTCA of LAD in 03/1998; recath a few weeks later-no restenosis; apical MI in 04/1998; restenosis of the LAD-stent placed; rotational atherectomy of first diagonal   BPH (benign prostatic hyperplasia)    Chronic prostatitis    Diverticulosis    Elevated PSA    Enlarged prostate    GERD (gastroesophageal reflux disease)    History of coronary angioplasty    Hyperlipidemia    Hypertension    Nicotine dependence    Past use of tobacco    40 pack years discontinued in 03/1998   Seborrheic keratoses    Spondylolysis    Past Surgical History:  Procedure Laterality Date   COLONOSCOPY     CORONARY STENT PLACEMENT     x 2   POLYPECTOMY  1997   Polyps removed on vocal cords    Social History   Tobacco Use   Smoking status: Former    Current packs/day: 0.00    Average packs/day: 1 pack/day for 38.2 years (38.2 ttl pk-yrs)    Types: Pipe, Cigarettes    Start date: 03/26/1960    Quit date: 06/21/1998    Years since quitting: 24.3   Smokeless tobacco: Never  Vaping Use   Vaping status: Never Used  Substance Use Topics   Alcohol use: Yes    Comment: Occas   Drug use: No   Family History  Problem Relation Age of Onset   Heart attack Mother     Heart attack Father    Heart disease Father    Breast cancer Daughter        age 92    CAD Other    Colon cancer Neg Hx    Allergies  Allergen Reactions   Pneumovax 23 [Pneumococcal Vac Polyvalent] Swelling   Yellow Jacket Venom [Bee Venom] Swelling   Penicillins Other (See Comments)    Thrush   Sulfa Antibiotics Rash   Review of Systems  Constitutional:  Negative for chills and fever.  HENT:  Negative for sore throat.   Respiratory:  Negative for cough and shortness of breath.   Cardiovascular:  Negative for chest pain, palpitations and leg swelling.  Gastrointestinal:  Negative for abdominal pain, blood in stool, constipation, diarrhea, nausea and vomiting.  Genitourinary:  Negative for dysuria and hematuria.  Musculoskeletal:  Negative for myalgias.  Skin:  Negative for itching and rash.  Neurological:  Negative for dizziness and headaches.  Psychiatric/Behavioral:  Negative for depression and suicidal ideas.      Objective:     BP 105/69   Pulse 75   Ht 5\' 7"  (1.702 m)   Wt 175 lb 9.6 oz (79.7 kg)   SpO2 95%  BMI 27.50 kg/m  BP Readings from Last 3 Encounters:  11/06/22 105/69  10/18/22 102/68  08/06/22 113/68   Physical Exam Vitals reviewed.  Constitutional:      General: He is not in acute distress.    Appearance: Normal appearance. He is not ill-appearing.  HENT:     Head: Normocephalic and atraumatic.     Right Ear: External ear normal.     Left Ear: External ear normal.     Nose: Nose normal. No congestion or rhinorrhea.     Mouth/Throat:     Mouth: Mucous membranes are moist.     Pharynx: Oropharynx is clear.  Eyes:     General: No scleral icterus.    Extraocular Movements: Extraocular movements intact.     Conjunctiva/sclera: Conjunctivae normal.     Pupils: Pupils are equal, round, and reactive to light.  Cardiovascular:     Rate and Rhythm: Normal rate and regular rhythm.     Pulses: Normal pulses.     Heart sounds: Normal heart sounds. No  murmur heard. Pulmonary:     Effort: Pulmonary effort is normal.     Breath sounds: Normal breath sounds. No wheezing, rhonchi or rales.  Abdominal:     General: Abdomen is flat. Bowel sounds are normal. There is no distension.     Palpations: Abdomen is soft.     Tenderness: There is no abdominal tenderness.  Musculoskeletal:        General: No swelling or deformity. Normal range of motion.     Cervical back: Normal range of motion.     Comments: Wearing braces on both knees  Skin:    General: Skin is warm and dry.     Capillary Refill: Capillary refill takes less than 2 seconds.  Neurological:     General: No focal deficit present.     Mental Status: He is alert and oriented to person, place, and time.     Motor: No weakness.  Psychiatric:        Mood and Affect: Mood normal.        Behavior: Behavior normal.        Thought Content: Thought content normal.   Last CBC Lab Results  Component Value Date   WBC 7.8 12/04/2013   HGB 14.8 12/04/2013   HCT 41.5 12/04/2013   MCV 84.7 12/04/2013   MCH 30.2 12/04/2013   RDW 14.0 12/04/2013   PLT 148 (L) 12/04/2013   Last metabolic panel Lab Results  Component Value Date   GLUCOSE 96 08/06/2022   NA 144 08/06/2022   K 3.8 08/06/2022   CL 105 08/06/2022   CO2 24 08/06/2022   BUN 14 08/06/2022   CREATININE 1.11 08/06/2022   EGFR 69 08/06/2022   CALCIUM 9.5 08/06/2022   PROT 6.7 07/27/2013   ALBUMIN 4.2 07/27/2013   BILITOT 0.9 07/27/2013   ALKPHOS 88 07/27/2013   AST 22 07/27/2013   ALT 22 07/27/2013   ANIONGAP 16 (H) 12/04/2013   Last lipids Lab Results  Component Value Date   CHOL 134 07/27/2013   HDL 41 07/27/2013   LDLCALC 77 07/27/2013   TRIG 80 07/27/2013   CHOLHDL 3.3 07/27/2013   Last thyroid functions Lab Results  Component Value Date   TSH 4.42 11/12/2011     Assessment & Plan:   Problem List Items Addressed This Visit       Hypertension - Primary    BP remains well-controlled on current  antihypertensive regimen.  No  medication changes are indicated today.      Gastroesophageal reflux    Symptoms remain adequately controlled with Nexium.  No medication changes are indicated today.      BPH (benign prostatic hyperplasia)    Symptoms remain adequately controlled with finasteride.  No medication changes are indicated today.      Elevated TSH    Noted on previous labs from February with TSH 6.2.  He remains asymptomatic.  We will repeat thyroid studies at follow-up in 6 months.      Prediabetes    A1c 6.0 on labs from February.  He is focused on dietary changes aimed at improving his blood sugar.  We will repeat labs at follow-up in 6 months.      Return in about 6 months (around 05/09/2023).   Billie Lade, MD

## 2022-11-06 NOTE — Assessment & Plan Note (Signed)
Noted on previous labs from February with TSH 6.2.  He remains asymptomatic.  We will repeat thyroid studies at follow-up in 6 months.

## 2022-11-06 NOTE — Assessment & Plan Note (Signed)
Symptoms remain adequately controlled with Nexium.  No medication changes are indicated today.

## 2022-11-06 NOTE — Assessment & Plan Note (Signed)
A1c 6.0 on labs from February.  He is focused on dietary changes aimed at improving his blood sugar.  We will repeat labs at follow-up in 6 months.

## 2022-11-06 NOTE — Assessment & Plan Note (Signed)
BP remains well-controlled on current antihypertensive regimen.  No medication changes are indicated today.

## 2022-11-06 NOTE — Patient Instructions (Addendum)
It was a pleasure to see you today.  Thank you for giving [place] the opportunity to be involved in your care.  Below is a brief recap of your visit and next steps.  We will plan to see you again in 6 months.  Summary No medication changes today We will plan for follow up in 6 months Repeat labs prior to your appointment  Schedule your Medicare Annual Wellness Visit at checkout.

## 2022-11-06 NOTE — Assessment & Plan Note (Signed)
Symptoms remain adequately controlled with finasteride.  No medication changes are indicated today.

## 2023-04-08 NOTE — Progress Notes (Signed)
Cardiology Office Note   Date:  04/19/2023   ID:  HAFIZ IRION, DOB [birth year], MRN [id number]  PCP:  Billie Lade, MD  Cardiologist:   German Manke [place], MD   No chief complaint on file.     History of Present Illness: Brent Brock is a 78 y.o. male who presents for follow up CAD.  He has with a history of a myocardial infarction in 2000 requiring percutaneous coronary intervention of the LAD ( [name]).  He also has a history of hypertension, hyperlipidemia,  and a former history of tobacco abuse. He is a retired Runner, broadcasting/film/video.   On follow up today he is doing very well. No cardiac symptoms of chest pain, dyspnea, palpitations. He is  limited by arthritis in his knees. Has been hesitant to have knee surgery. BP diary shows excellent BP control. No dizziness. He feels he is doing well.    Past Medical History:  Diagnosis Date   Anxiety    ASCVD (arteriosclerotic cardiovascular disease)     PTCA of LAD in 03/1998; recath a few weeks later-no restenosis; apical MI in 04/1998; restenosis of the LAD-stent placed; rotational atherectomy of first diagonal   BPH (benign prostatic hyperplasia)    Chronic prostatitis    Diverticulosis    Elevated PSA    Enlarged prostate    GERD (gastroesophageal reflux disease)    History of coronary angioplasty    Hyperlipidemia    Hypertension    Nicotine dependence    Past use of tobacco    40 pack years discontinued in 03/1998   Seborrheic keratoses    Spondylolysis     Past Surgical History:  Procedure Laterality Date   COLONOSCOPY     CORONARY STENT PLACEMENT     x 2   POLYPECTOMY  1997   Polyps removed on vocal cords      Current Outpatient Medications  Medication Sig Dispense Refill   amLODipine (NORVASC) 5 MG tablet Take 1 tablet (5 mg total) by mouth daily. 90 tablet 3   aspirin EC 81 MG tablet Take 81 mg by mouth daily.     atorvastatin (LIPITOR) 40 MG tablet Take 1 tablet (40 mg total) by mouth daily. 90 tablet 3    esomeprazole (NEXIUM) 40 MG capsule Take 40 mg by mouth every other day.     finasteride (PROSCAR) 5 MG tablet Take 5 mg by mouth daily.     lisinopril-hydrochlorothiazide (ZESTORETIC) 20-12.5 MG tablet Take 1 tablet by mouth daily. 90 tablet 3   metoprolol succinate (TOPROL-XL) 50 MG 24 hr tablet TAKE ONE (1) TABLET BY MOUTH EVERY DAY 90 tablet 3   nitroGLYCERIN (NITROSTAT) 0.4 MG SL tablet Place 1 tablet (0.4 mg total) under the tongue every 5 (five) minutes as needed for chest pain. 25 tablet 3   No current facility-administered medications for this visit.    Allergies:   Pneumovax 23 [pneumococcal vac polyvalent], Yellow jacket venom [bee venom], Penicillins, and Sulfa antibiotics    Social History:  The patient  reports that he quit smoking about 24 years ago. His smoking use included pipe and cigarettes. He started smoking about 63 years ago. He has a 38.2 pack-year smoking history. He has never used smokeless tobacco. He reports current alcohol use. He reports that he does not use drugs.   Family History:  The patient's family history includes Breast cancer in his daughter; CAD in an other family member; Heart attack in his father and mother;  Heart disease in his father.    ROS:  Please see the history of present illness.   Otherwise, review of systems are positive for none.   All other systems are reviewed and negative.    PHYSICAL EXAM: VS:  BP 114/80 (BP Location: Left Arm, Patient Position: Sitting, Cuff Size: Normal)   Pulse 61   Ht 5\' 6"  (1.676 m)   Wt 177 lb 3.2 oz (80.4 kg)   BMI 28.60 kg/m  , BMI Body mass index is 28.6 kg/m. GEN: Well nourished, well developed, in no acute distress HEENT: normal Neck: no JVD, carotid bruits, or masses Cardiac: RRR; no murmurs, rubs, or gallops,no edema  Respiratory:  clear to auscultation bilaterally, normal work of breathing GI: soft, nontender, nondistended, + BS MS: no deformity or atrophy Skin: warm and dry, no rash Neuro:   Strength and sensation are intact Psych: euthymic mood, full affect   EKG Interpretation Date/Time:  Friday April 19 2023 09:02:30 EST Ventricular Rate:  61 PR Interval:  176 QRS Duration:  98 QT Interval:  404 QTC Calculation: 406 R Axis:   -7  Text Interpretation: Normal sinus rhythm Normal ECG When compared with ECG of Apr 24, 2022 No significant change since last tracing Confirmed by [place], Jhane Lorio [phone]) on 04/19/2023 9:18:09 AM     Recent Labs: 08/06/2022: BUN 14; Creatinine, Ser 1.11; Potassium 3.8; Sodium 144    Lipid Panel    Component Value Date/Time   CHOL 134 07/27/2013 1401   TRIG 80 07/27/2013 1401   TRIG 163 05/19/2009 0000   HDL 41 07/27/2013 1401   CHOLHDL 3.3 07/27/2013 1401   VLDL 16 07/27/2013 1401   LDLCALC 77 07/27/2013 1401    Labs dated 06/14/21: cholesterol 130, triglycerides 91, LDL 75. HDL 38. BUN 27, creatinine 1.36. glucose 129- nonfasting. CBC normal.  Dated 05/17/22: cholesterol 124, triglycerides 134, LDL 59. HDL 43. Glucose 118. A1c 6%. Creatinine 1.23. otherwise CMET normal. TSH 6.12. CBC normal.   Wt Readings from Last 3 Encounters:  04/19/23 177 lb 3.2 oz (80.4 kg)  11/06/22 175 lb 9.6 oz (79.7 kg)  10/18/22 173 lb 3.2 oz (78.6 kg)      Other studies Reviewed: Additional studies/ records that were reviewed today include: none. Review of the above records demonstrates: N/A   ASSESSMENT AND PLAN:  1.  CAD:  Remote intervention in setting of MI in 2000 with POBA of LAD.    Remains asymptomatic. Continue ASA, statin, beta blocker and amlodipine   2. Essential hypertension:BP is very well controlled.  He denies dizziness. I would consider reducing his BP medication now but he wants to continue his current regimen.    3  Hyperlipidemia  LDL 59. Due for follow up labs with PCP next month     Current medicines are reviewed at length with the patient today.  The patient does not have concerns regarding medicines.  The following  changes have been made:  no change  Labs/ tests ordered today include:   Orders Placed This Encounter  Procedures   EKG 12-Lead         Disposition:   FU with me in 6 months  Signed, Dasiah Hooley [place], MD  04/19/2023 9:18 AM    [facility] [address], [place], [place], [address] Phone [phone], Fax [phone]

## 2023-04-19 ENCOUNTER — Ambulatory Visit: Payer: Medicare PPO | Attending: Cardiology | Admitting: Cardiology

## 2023-04-19 ENCOUNTER — Encounter: Payer: Self-pay | Admitting: Cardiology

## 2023-04-19 VITALS — BP 114/80 | HR 61 | Ht 66.0 in | Wt 177.2 lb

## 2023-04-19 DIAGNOSIS — E785 Hyperlipidemia, unspecified: Secondary | ICD-10-CM | POA: Diagnosis not present

## 2023-04-19 DIAGNOSIS — I10 Essential (primary) hypertension: Secondary | ICD-10-CM | POA: Diagnosis not present

## 2023-04-19 DIAGNOSIS — I2510 Atherosclerotic heart disease of native coronary artery without angina pectoris: Secondary | ICD-10-CM | POA: Diagnosis not present

## 2023-04-19 NOTE — Patient Instructions (Signed)
Medication Instructions:  Continue same medications *If you need a refill on your cardiac medications before your next appointment, please call your pharmacy*   Lab Work: None ordered   Testing/Procedures: None ordered   Follow-Up: At [facility], you and your health needs are our priority.  As part of our continuing mission to provide you with exceptional heart care, we have created designated Provider Care Teams.  These Care Teams include your primary Cardiologist (physician) and Advanced Practice Providers (APPs -  Physician Assistants and Nurse Practitioners) who all work together to provide you with the care you need, when you need it.  We recommend signing up for the patient portal called "MyChart".  Sign up information is provided on this After Visit Summary.  MyChart is used to connect with patients for Virtual Visits (Telemedicine).  Patients are able to view lab/test results, encounter notes, upcoming appointments, etc.  Non-urgent messages can be sent to your provider as well.   To learn more about what you can do with MyChart, go to [email or web address].    Your next appointment:  6 months    Call in April to schedule July appointment     Provider:  Dr.Jordan

## 2023-04-29 ENCOUNTER — Encounter: Payer: Self-pay | Admitting: Internal Medicine

## 2023-04-29 ENCOUNTER — Ambulatory Visit: Payer: Medicare PPO | Admitting: Internal Medicine

## 2023-04-29 VITALS — BP 103/68 | HR 82 | Ht 67.0 in | Wt 178.0 lb

## 2023-04-29 DIAGNOSIS — Z0001 Encounter for general adult medical examination with abnormal findings: Secondary | ICD-10-CM | POA: Diagnosis not present

## 2023-04-29 NOTE — Assessment & Plan Note (Signed)
Annual physical completed today.  Previous records and labs reviewed. -Repeat labs ordered today, including one-time HCV screening -Preventative care items are largely up-to-date -He will return to care for previously scheduled follow-up on 2/11 for lab review and to address chronic medical conditions.

## 2023-04-29 NOTE — Progress Notes (Signed)
Complete physical exam  Patient: Brent Brock   DOB: 10-30-1945   78 y.o. Male  MRN: [id number]  Subjective:    Chief Complaint  Patient presents with   Care Management    Six month follow up     Brent Brock is a 78 y.o. male who presents today for a complete physical exam. He reports consuming a general diet. The patient does not participate in regular exercise at present. He generally feels well. He reports sleeping fairly well. He does not have additional problems to discuss today.    Most recent fall risk assessment:    04/29/2023    1:10 PM  Fall Risk   Falls in the past year? 0  Number falls in past yr: 0  Injury with Fall? 0  Risk for fall due to : No Fall Risks  Follow up Falls evaluation completed     Most recent depression screenings:    04/29/2023    1:10 PM 11/06/2022   10:48 AM  PHQ 2/9 Scores  PHQ - 2 Score 0 0  PHQ- 9 Score  0    Vision:Within last year and Dental: No current dental problems and Receives regular dental care  Past Medical History:  Diagnosis Date   Anxiety    ASCVD (arteriosclerotic cardiovascular disease)     PTCA of LAD in 03/1998; recath a few weeks later-no restenosis; apical MI in 04/1998; restenosis of the LAD-stent placed; rotational atherectomy of first diagonal   BPH (benign prostatic hyperplasia)    Chronic prostatitis    Diverticulosis    Elevated PSA    Enlarged prostate    GERD (gastroesophageal reflux disease)    History of coronary angioplasty    Hyperlipidemia    Hypertension    Nicotine dependence    Past use of tobacco    40 pack years discontinued in 03/1998   Seborrheic keratoses    Spondylolysis    Past Surgical History:  Procedure Laterality Date   COLONOSCOPY     CORONARY STENT PLACEMENT     x 2   POLYPECTOMY  1997   Polyps removed on vocal cords    Social History   Tobacco Use   Smoking status: Former    Current packs/day: 0.00    Average packs/day: 1 pack/day for 38.2 years (38.2 ttl pk-yrs)     Types: Pipe, Cigarettes    Start date: 03/26/1960    Quit date: 06/21/1998    Years since quitting: 24.8   Smokeless tobacco: Never  Vaping Use   Vaping status: Never Used  Substance Use Topics   Alcohol use: Yes    Comment: Occas   Drug use: No   Family History  Problem Relation Age of Onset   Heart attack Mother    Heart attack Father    Heart disease Father    Breast cancer Daughter        age 58    CAD Other    Colon cancer Neg Hx    Allergies  Allergen Reactions   Pneumovax 23 [Pneumococcal Vac Polyvalent] Swelling   Yellow Jacket Venom [Bee Venom] Swelling   Penicillins Other (See Comments)    Thrush   Sulfa Antibiotics Rash   Patient Care Team: Billie Lade, MD as PCP - General (Internal Medicine) [place], Peter M, MD as PCP - Cardiology (Cardiology)   Outpatient Medications Prior to Visit  Medication Sig   amLODipine (NORVASC) 5 MG tablet Take 1 tablet (5 mg total)  by mouth daily.   aspirin EC 81 MG tablet Take 81 mg by mouth daily.   atorvastatin (LIPITOR) 40 MG tablet Take 1 tablet (40 mg total) by mouth daily.   esomeprazole (NEXIUM) 40 MG capsule Take 40 mg by mouth every other day.   finasteride (PROSCAR) 5 MG tablet Take 5 mg by mouth daily.   lisinopril-hydrochlorothiazide (ZESTORETIC) 20-12.5 MG tablet Take 1 tablet by mouth daily.   metoprolol succinate (TOPROL-XL) 50 MG 24 hr tablet TAKE ONE (1) TABLET BY MOUTH EVERY DAY   nitroGLYCERIN (NITROSTAT) 0.4 MG SL tablet Place 1 tablet (0.4 mg total) under the tongue every 5 (five) minutes as needed for chest pain.   No facility-administered medications prior to visit.   Review of Systems  Constitutional:  Negative for chills and fever.  HENT:  Negative for sore throat.   Respiratory:  Negative for cough and shortness of breath.   Cardiovascular:  Negative for chest pain, palpitations and leg swelling.  Gastrointestinal:  Negative for abdominal pain, blood in stool, constipation, diarrhea, nausea  and vomiting.  Genitourinary:  Negative for dysuria and hematuria.  Musculoskeletal:  Negative for myalgias.  Skin:  Negative for itching and rash.  Neurological:  Negative for dizziness and headaches.  Psychiatric/Behavioral:  Negative for depression and suicidal ideas.       Objective:     BP 103/68 (BP Location: Right Arm, Patient Position: Sitting, Cuff Size: Normal)   Pulse 82   Ht 5\' 7"  (1.702 m)   Wt 178 lb (80.7 kg)   SpO2 94%   BMI 27.88 kg/m  BP Readings from Last 3 Encounters:  04/29/23 103/68  04/19/23 114/80  11/06/22 105/69   Physical Exam Vitals reviewed.  Constitutional:      General: He is not in acute distress.    Appearance: Normal appearance. He is not ill-appearing.  HENT:     Head: Normocephalic and atraumatic.     Right Ear: External ear normal.     Left Ear: External ear normal.     Nose: Nose normal. No congestion or rhinorrhea.     Mouth/Throat:     Mouth: Mucous membranes are moist.     Pharynx: Oropharynx is clear.  Eyes:     General: No scleral icterus.    Extraocular Movements: Extraocular movements intact.     Conjunctiva/sclera: Conjunctivae normal.     Pupils: Pupils are equal, round, and reactive to light.  Cardiovascular:     Rate and Rhythm: Normal rate and regular rhythm.     Pulses: Normal pulses.     Heart sounds: Normal heart sounds. No murmur heard. Pulmonary:     Effort: Pulmonary effort is normal.     Breath sounds: Normal breath sounds. No wheezing, rhonchi or rales.  Abdominal:     General: Abdomen is flat. Bowel sounds are normal. There is no distension.     Palpations: Abdomen is soft.     Tenderness: There is no abdominal tenderness.  Musculoskeletal:        General: No swelling or deformity. Normal range of motion.     Cervical back: Normal range of motion.     Comments: Wearing braces on both knees  Skin:    General: Skin is warm and dry.     Capillary Refill: Capillary refill takes less than 2 seconds.   Neurological:     General: No focal deficit present.     Mental Status: He is alert and oriented to person, place, and  time.     Motor: No weakness.  Psychiatric:        Mood and Affect: Mood normal.        Behavior: Behavior normal.        Thought Content: Thought content normal.         Assessment & Plan:    Routine Health Maintenance and Physical Exam  Immunization History  Administered Date(s) Administered   Fluad Quad(high Dose 65+) 11/25/2018, 12/25/2022   Influenza, High Dose Seasonal PF 11/12/2016   Influenza,inj,quad, With Preservative 11/26/2016   Moderna Sars-Covid-2 Vaccination 04/28/2019   Pneumococcal Conjugate-13 02/09/2014   Pneumococcal Polysaccharide-23 07/15/2015   Zoster Recombinant(Shingrix) 11/12/2016, 01/14/2017    Health Maintenance  Topic Date Due   Medicare Annual Wellness (AWV)  Never done   Hepatitis C Screening  Never done   DTaP/Tdap/Td (1 - Tdap) Never done   COVID-19 Vaccine (2 - Moderna risk series) 05/26/2019   Pneumonia Vaccine 29+ Years old  Completed   INFLUENZA VACCINE  Completed   Zoster Vaccines- Shingrix  Completed   HPV VACCINES  Aged Out    Discussed health benefits of physical activity, and encouraged him to engage in regular exercise appropriate for his age and condition.  Problem List Items Addressed This Visit       Encounter for well adult exam with abnormal findings - Primary   Annual physical completed today.  Previous records and labs reviewed. -Repeat labs ordered today, including one-time HCV screening -Preventative care items are largely up-to-date -He will return to care for previously scheduled follow-up on 2/11 for lab review and to address chronic medical conditions.      Return in about 8 days (around 05/07/2023).  Billie Lade, MD

## 2023-04-29 NOTE — Patient Instructions (Signed)
It was a pleasure to see you today.  Thank you for giving [place] the opportunity to be involved in your care.  Below is a brief recap of your visit and next steps.  We will plan to see you again on 05/07/23.  Summary Annual physical completed today Repeat labs ordered Follow up in 1 week as previously scheduled

## 2023-05-01 LAB — CBC WITH DIFFERENTIAL/PLATELET
Basophils Absolute: 0.1 10*3/uL (ref 0.0–0.2)
Basos: 1 %
EOS (ABSOLUTE): 0.3 10*3/uL (ref 0.0–0.4)
Eos: 3 %
Hematocrit: 45 % (ref 37.5–51.0)
Hemoglobin: 15.4 g/dL (ref 13.0–17.7)
Immature Grans (Abs): 0 10*3/uL (ref 0.0–0.1)
Immature Granulocytes: 0 %
Lymphocytes Absolute: 2.6 10*3/uL (ref 0.7–3.1)
Lymphs: 28 %
MCH: 30.1 pg (ref 26.6–33.0)
MCHC: 34.2 g/dL (ref 31.5–35.7)
MCV: 88 fL (ref 79–97)
Monocytes Absolute: 0.8 10*3/uL (ref 0.1–0.9)
Monocytes: 9 %
Neutrophils Absolute: 5.5 10*3/uL (ref 1.4–7.0)
Neutrophils: 59 %
Platelets: 172 10*3/uL (ref 150–450)
RBC: 5.11 x10E6/uL (ref 4.14–5.80)
RDW: 13.2 % (ref 11.6–15.4)
WBC: 9.2 10*3/uL (ref 3.4–10.8)

## 2023-05-01 LAB — B12 AND FOLATE PANEL
Folate: 5.9 ng/mL (ref >3.0)
Vitamin B-12: 278 pg/mL (ref 232–1245)

## 2023-05-01 LAB — CMP14+EGFR
ALT: 18 [IU]/L (ref 0–44)
AST: 22 [IU]/L (ref 0–40)
Albumin: 4.4 g/dL (ref 3.8–4.8)
Alkaline Phosphatase: 105 [IU]/L (ref 44–121)
BUN/Creatinine Ratio: 14 (ref 10–24)
BUN: 19 mg/dL (ref 8–27)
Bilirubin Total: 0.8 mg/dL (ref 0.0–1.2)
CO2: 23 mmol/L (ref 20–29)
Calcium: 9.7 mg/dL (ref 8.6–10.2)
Chloride: 106 mmol/L (ref 96–106)
Creatinine, Ser: 1.36 mg/dL — ABNORMAL HIGH (ref 0.76–1.27)
Globulin, Total: 2.6 g/dL (ref 1.5–4.5)
Glucose: 89 mg/dL (ref 70–99)
Potassium: 4.5 mmol/L (ref 3.5–5.2)
Sodium: 145 mmol/L — ABNORMAL HIGH (ref 134–144)
Total Protein: 7 g/dL (ref 6.0–8.5)
eGFR: 54 mL/min/{1.73_m2} — ABNORMAL LOW (ref >59)

## 2023-05-01 LAB — VITAMIN D 25 HYDROXY (VIT D DEFICIENCY, FRACTURES): Vit D, 25-Hydroxy: 21.2 ng/mL — ABNORMAL LOW (ref 30.0–100.0)

## 2023-05-01 LAB — HEMOGLOBIN A1C
Est. average glucose Bld gHb Est-mCnc: 128 mg/dL
Hgb A1c MFr Bld: 6.1 % — ABNORMAL HIGH (ref 4.8–5.6)

## 2023-05-01 LAB — LIPID PANEL
Chol/HDL Ratio: 3.3 {ratio} (ref 0.0–5.0)
Cholesterol, Total: 129 mg/dL (ref 100–199)
HDL: 39 mg/dL — ABNORMAL LOW (ref >39)
LDL Chol Calc (NIH): 65 mg/dL (ref 0–99)
Triglycerides: 140 mg/dL (ref 0–149)
VLDL Cholesterol Cal: 25 mg/dL (ref 5–40)

## 2023-05-01 LAB — TSH+FREE T4
Free T4: 1.14 ng/dL (ref 0.82–1.77)
TSH: 6.09 u[IU]/mL — ABNORMAL HIGH (ref 0.450–4.500)

## 2023-05-01 LAB — HCV AB W REFLEX TO QUANT PCR

## 2023-05-01 LAB — HCV INTERPRETATION

## 2023-05-07 ENCOUNTER — Encounter: Payer: Self-pay | Admitting: Internal Medicine

## 2023-05-07 ENCOUNTER — Telehealth: Payer: Self-pay

## 2023-05-07 ENCOUNTER — Ambulatory Visit: Payer: Medicare PPO | Admitting: Internal Medicine

## 2023-05-07 VITALS — BP 132/80 | HR 71 | Ht 67.0 in | Wt 178.8 lb

## 2023-05-07 DIAGNOSIS — R7989 Other specified abnormal findings of blood chemistry: Secondary | ICD-10-CM

## 2023-05-07 DIAGNOSIS — E559 Vitamin D deficiency, unspecified: Secondary | ICD-10-CM | POA: Diagnosis not present

## 2023-05-07 DIAGNOSIS — N1831 Chronic kidney disease, stage 3a: Secondary | ICD-10-CM

## 2023-05-07 DIAGNOSIS — R7303 Prediabetes: Secondary | ICD-10-CM

## 2023-05-07 DIAGNOSIS — K219 Gastro-esophageal reflux disease without esophagitis: Secondary | ICD-10-CM | POA: Diagnosis not present

## 2023-05-07 MED ORDER — ESOMEPRAZOLE MAGNESIUM 20 MG PO CPDR: 20.0000 mg | DELAYED_RELEASE_CAPSULE | Freq: Every day | ORAL | 3 refills | Status: DC

## 2023-05-07 NOTE — Telephone Encounter (Signed)
Pharmacist advised

## 2023-05-07 NOTE — Progress Notes (Signed)
 Established Patient Office Visit  Subjective   Patient ID: Brent Brock, male    DOB: 1945/12/21  Age: 78 y.o. MRN: [id number]  Chief Complaint  Patient presents with   bloodwork    Discuss recent blood work    Brent Brock returns to care today for follow-up.  He was last seen by me on 2/3 for his annual physical.  Labs were ordered at that time and he returns to care today for lab review.  There have been no acute interval events.  He reports feeling well today and has no acute concerns to discuss.  Past Medical History:  Diagnosis Date   Anxiety    ASCVD (arteriosclerotic cardiovascular disease)     PTCA of LAD in 03/1998; recath a few weeks later-no restenosis; apical MI in 04/1998; restenosis of the LAD-stent placed; rotational atherectomy of first diagonal   BPH (benign prostatic hyperplasia)    Chronic prostatitis    Diverticulosis    Elevated PSA    Enlarged prostate    GERD (gastroesophageal reflux disease)    History of coronary angioplasty    Hyperlipidemia    Hypertension    Nicotine dependence    Past use of tobacco    40 pack years discontinued in 03/1998   Seborrheic keratoses    Spondylolysis    Past Surgical History:  Procedure Laterality Date   COLONOSCOPY     CORONARY STENT PLACEMENT     x 2   POLYPECTOMY  1997   Polyps removed on vocal cords    Social History   Tobacco Use   Smoking status: Former    Current packs/day: 0.00    Average packs/day: 1 pack/day for 38.2 years (38.2 ttl pk-yrs)    Types: Pipe, Cigarettes    Start date: 03/26/1960    Quit date: 06/21/1998    Years since quitting: 24.9   Smokeless tobacco: Never  Vaping Use   Vaping status: Never Used  Substance Use Topics   Alcohol use: Yes    Comment: Occas   Drug use: No   Family History  Problem Relation Age of Onset   Heart attack Mother    Heart attack Father    Heart disease Father    Breast cancer Daughter        age 90    CAD Other    Colon cancer Neg Hx     Allergies  Allergen Reactions   Pneumovax 23 [Pneumococcal Vac Polyvalent] Swelling   Yellow Jacket Venom [Bee Venom] Swelling   Penicillins Other (See Comments)    Thrush   Sulfa Antibiotics Rash   Review of Systems  Constitutional:  Negative for chills and fever.  HENT:  Negative for sore throat.   Respiratory:  Negative for cough and shortness of breath.   Cardiovascular:  Negative for chest pain, palpitations and leg swelling.  Gastrointestinal:  Negative for abdominal pain, blood in stool, constipation, diarrhea, nausea and vomiting.  Genitourinary:  Negative for dysuria and hematuria.  Musculoskeletal:  Negative for myalgias.  Skin:  Negative for itching and rash.  Neurological:  Negative for dizziness and headaches.  Psychiatric/Behavioral:  Negative for depression and suicidal ideas.      Objective:     BP 132/80 (BP Location: Left Arm, Patient Position: Sitting, Cuff Size: Normal)   Pulse 71   Ht 5\' 7"  (1.702 m)   Wt 178 lb 12.8 oz (81.1 kg)   SpO2 97%   BMI 28.00 kg/m  BP Readings from  Last 3 Encounters:  05/07/23 132/80  04/29/23 103/68  04/19/23 114/80   Physical Exam Vitals reviewed.  Constitutional:      General: He is not in acute distress.    Appearance: Normal appearance. He is not ill-appearing.  HENT:     Head: Normocephalic and atraumatic.     Right Ear: External ear normal.     Left Ear: External ear normal.     Nose: Nose normal. No congestion or rhinorrhea.     Mouth/Throat:     Mouth: Mucous membranes are moist.     Pharynx: Oropharynx is clear.  Eyes:     General: No scleral icterus.    Extraocular Movements: Extraocular movements intact.     Conjunctiva/sclera: Conjunctivae normal.     Pupils: Pupils are equal, round, and reactive to light.  Cardiovascular:     Rate and Rhythm: Normal rate and regular rhythm.     Pulses: Normal pulses.     Heart sounds: Normal heart sounds. No murmur heard. Pulmonary:     Effort: Pulmonary  effort is normal.     Breath sounds: Normal breath sounds. No wheezing, rhonchi or rales.  Abdominal:     General: Abdomen is flat. Bowel sounds are normal. There is no distension.     Palpations: Abdomen is soft.     Tenderness: There is no abdominal tenderness.  Musculoskeletal:        General: No swelling or deformity. Normal range of motion.     Cervical back: Normal range of motion.     Comments: Wearing braces on both knees  Skin:    General: Skin is warm and dry.     Capillary Refill: Capillary refill takes less than 2 seconds.  Neurological:     General: No focal deficit present.     Mental Status: He is alert and oriented to person, place, and time.     Motor: No weakness.  Psychiatric:        Mood and Affect: Mood normal.        Behavior: Behavior normal.        Thought Content: Thought content normal.   Last CBC Lab Results  Component Value Date   WBC 9.2 04/29/2023   HGB 15.4 04/29/2023   HCT 45.0 04/29/2023   MCV 88 04/29/2023   MCH 30.1 04/29/2023   RDW 13.2 04/29/2023   PLT 172 04/29/2023   Last metabolic panel Lab Results  Component Value Date   GLUCOSE 89 04/29/2023   NA 145 (H) 04/29/2023   K 4.5 04/29/2023   CL 106 04/29/2023   CO2 23 04/29/2023   BUN 19 04/29/2023   CREATININE 1.36 (H) 04/29/2023   EGFR 54 (L) 04/29/2023   CALCIUM 9.7 04/29/2023   PROT 7.0 04/29/2023   ALBUMIN 4.4 04/29/2023   LABGLOB 2.6 04/29/2023   BILITOT 0.8 04/29/2023   ALKPHOS 105 04/29/2023   AST 22 04/29/2023   ALT 18 04/29/2023   ANIONGAP 16 (H) 12/04/2013   Last lipids Lab Results  Component Value Date   CHOL 129 04/29/2023   HDL 39 (L) 04/29/2023   LDLCALC 65 04/29/2023   TRIG 140 04/29/2023   CHOLHDL 3.3 04/29/2023   Last hemoglobin A1c Lab Results  Component Value Date   HGBA1C 6.1 (H) 04/29/2023   Last thyroid functions Lab Results  Component Value Date   TSH 6.090 (H) 04/29/2023   Last vitamin D Lab Results  Component Value Date   VD25OH  21.2 (L) 04/29/2023  Last vitamin B12 and Folate Lab Results  Component Value Date   VITAMINB12 278 04/29/2023   FOLATE 5.9 04/29/2023     Assessment & Plan:   Problem List Items Addressed This Visit       Gastroesophageal reflux - Primary   Currently prescribed Nexium 40 mg every other day.  He does not feel that symptoms are controlled on this regimen.  Through shared decision making, he will switch to Nexium 20 mg daily.  Can increase to 40 mg daily if this does not adequately control his symptoms.      Chronic kidney disease, stage 3a (HCC)   Labs from earlier this month consistent with CKD 3A.  Currently prescribed lisinopril.  Renal function has remained stable over the last year.  No additional medication changes are indicated at this time.      Elevated TSH   TSH remains mildly elevated at 6.0.  Elevated at 6.2 on labs from February 2024.  He remains asymptomatic.  Will continue to monitor.      Prediabetes   A1c 6.1 on labs from earlier this month.  6.0 on labs from February 2024.  He will continue to focus on dietary changes in an effort to lower his blood sugar.      Vitamin D insufficiency   Daily vitamin D supplementation recommended      Return in about 6 months (around 11/04/2023).   Billie Lade, MD

## 2023-05-07 NOTE — Patient Instructions (Signed)
It was a pleasure to see you today.  Thank you for giving [place] the opportunity to be involved in your care.  Below is a brief recap of your visit and next steps.  We will plan to see you again in 6 months.  Summary Labs reviewed I recommend starting daily vitamin D supplementation Follow up in 6 months

## 2023-05-07 NOTE — Telephone Encounter (Signed)
Copied from CRM [id number]. Topic: Clinical - Prescription Issue >> May 07, 2023 12:07 PM Fuller Mandril wrote: Reason for CRM: Pharmacy called states they received prescription for esomeprazole (NEXIUM) 20 MG capsule. Patient usually taked brand name 40mg . Checking to make sure this new Rx is correct. Callback [phone]

## 2023-05-27 DIAGNOSIS — N1831 Chronic kidney disease, stage 3a: Secondary | ICD-10-CM | POA: Insufficient documentation

## 2023-05-27 DIAGNOSIS — E559 Vitamin D deficiency, unspecified: Secondary | ICD-10-CM | POA: Insufficient documentation

## 2023-05-27 NOTE — Assessment & Plan Note (Signed)
Daily vitamin D supplementation recommended

## 2023-05-27 NOTE — Assessment & Plan Note (Signed)
 Currently prescribed Nexium 40 mg every other day.  He does not feel that symptoms are controlled on this regimen.  Through shared decision making, he will switch to Nexium 20 mg daily.  Can increase to 40 mg daily if this does not adequately control his symptoms.

## 2023-05-27 NOTE — Assessment & Plan Note (Signed)
 TSH remains mildly elevated at 6.0.  Elevated at 6.2 on labs from February 2024.  He remains asymptomatic.  Will continue to monitor.

## 2023-05-27 NOTE — Assessment & Plan Note (Signed)
 Labs from earlier this month consistent with CKD 3A.  Currently prescribed lisinopril.  Renal function has remained stable over the last year.  No additional medication changes are indicated at this time.

## 2023-05-27 NOTE — Assessment & Plan Note (Signed)
 A1c 6.1 on labs from earlier this month.  6.0 on labs from February 2024.  He will continue to focus on dietary changes in an effort to lower his blood sugar.

## 2023-06-05 ENCOUNTER — Ambulatory Visit: Payer: Self-pay

## 2023-07-01 DIAGNOSIS — M9903 Segmental and somatic dysfunction of lumbar region: Secondary | ICD-10-CM | POA: Diagnosis not present

## 2023-07-01 DIAGNOSIS — M62830 Muscle spasm of back: Secondary | ICD-10-CM | POA: Diagnosis not present

## 2023-07-01 DIAGNOSIS — M9902 Segmental and somatic dysfunction of thoracic region: Secondary | ICD-10-CM | POA: Diagnosis not present

## 2023-07-01 DIAGNOSIS — M9905 Segmental and somatic dysfunction of pelvic region: Secondary | ICD-10-CM | POA: Diagnosis not present

## 2023-07-29 ENCOUNTER — Ambulatory Visit: Admitting: Internal Medicine

## 2023-07-29 ENCOUNTER — Encounter: Payer: Self-pay | Admitting: Internal Medicine

## 2023-07-29 DIAGNOSIS — K219 Gastro-esophageal reflux disease without esophagitis: Secondary | ICD-10-CM

## 2023-07-29 MED ORDER — ESOMEPRAZOLE MAGNESIUM 20 MG PO CPDR: 20.0000 mg | DELAYED_RELEASE_CAPSULE | Freq: Every day | ORAL | 2 refills | Status: DC

## 2023-07-29 NOTE — Progress Notes (Signed)
 Acute Office Visit  Subjective:     Patient ID: Brent Brock, male    DOB: March 27, 1945, 78 y.o.   MRN: [id number]  Chief Complaint  Patient presents with   Medication Refill    Refill on all medications    Brent Brock presents today for an acute visit requesting medication refills.  He specifically requests a printed prescription for Nexium .  He does not have any additional concerns to discuss and otherwise feels well.  Review of Systems  Constitutional:  Negative for chills and fever.  HENT:  Negative for sore throat.   Respiratory:  Negative for cough and shortness of breath.   Cardiovascular:  Negative for chest pain, palpitations and leg swelling.  Gastrointestinal:  Negative for abdominal pain, blood in stool, constipation, diarrhea, nausea and vomiting.  Genitourinary:  Negative for dysuria and hematuria.  Musculoskeletal:  Negative for myalgias.  Skin:  Negative for itching and rash.  Neurological:  Negative for dizziness and headaches.  Psychiatric/Behavioral:  Negative for depression and suicidal ideas.       Objective:    BP 103/69   Pulse 75   Ht 5\' 7"  (1.702 m)   Wt 179 lb 3.2 oz (81.3 kg)   SpO2 96%   BMI 28.07 kg/m   Physical Exam Vitals reviewed.  Constitutional:      General: He is not in acute distress.    Appearance: Normal appearance. He is not ill-appearing.  HENT:     Head: Normocephalic and atraumatic.     Right Ear: External ear normal.     Left Ear: External ear normal.     Nose: Nose normal. No congestion or rhinorrhea.     Mouth/Throat:     Mouth: Mucous membranes are moist.     Pharynx: Oropharynx is clear.  Eyes:     General: No scleral icterus.    Extraocular Movements: Extraocular movements intact.     Conjunctiva/sclera: Conjunctivae normal.     Pupils: Pupils are equal, round, and reactive to light.  Cardiovascular:     Rate and Rhythm: Normal rate and regular rhythm.     Pulses: Normal pulses.     Heart sounds: Normal heart  sounds. No murmur heard. Pulmonary:     Effort: Pulmonary effort is normal.     Breath sounds: Normal breath sounds. No wheezing, rhonchi or rales.  Abdominal:     General: Abdomen is flat. Bowel sounds are normal. There is no distension.     Palpations: Abdomen is soft.     Tenderness: There is no abdominal tenderness.  Musculoskeletal:        General: No swelling or deformity. Normal range of motion.     Cervical back: Normal range of motion.     Comments: Wearing braces on both knees  Skin:    General: Skin is warm and dry.     Capillary Refill: Capillary refill takes less than 2 seconds.  Neurological:     General: No focal deficit present.     Mental Status: He is alert and oriented to person, place, and time.     Motor: No weakness.  Psychiatric:        Mood and Affect: Mood normal.        Behavior: Behavior normal.        Thought Content: Thought content normal.       Assessment & Plan:   Problem List Items Addressed This Visit       Gastroesophageal reflux  Presenting today for an acute visit requesting medication refills.  He specifically requests a printed prescription for Nexium , which he currently takes 20 mg daily.  This adequately controls his symptoms.  Printed prescription provided today.  He will return to care for follow-up in August.       Meds ordered this encounter  Medications   esomeprazole  (NEXIUM ) 20 MG capsule    Sig: Take 1 capsule (20 mg total) by mouth daily at 12 noon.    Dispense:  30 capsule    Refill:  2    Return in about 3 months (around 10/29/2023).  Tobi Fortes, MD

## 2023-07-29 NOTE — Patient Instructions (Signed)
 It was a pleasure to see you today.  Thank you for giving us  the opportunity to be involved in your care.  Below is a brief recap of your visit and next steps.  We will plan to see you again in 3 months.  Summary Nexium  refilled Follow up in three months with Rice Chamorro.

## 2023-07-29 NOTE — Assessment & Plan Note (Signed)
 Presenting today for an acute visit requesting medication refills.  He specifically requests a printed prescription for Nexium , which he currently takes 20 mg daily.  This adequately controls his symptoms.  Printed prescription provided today.  He will return to care for follow-up in August.

## 2023-08-02 DIAGNOSIS — M9905 Segmental and somatic dysfunction of pelvic region: Secondary | ICD-10-CM | POA: Diagnosis not present

## 2023-08-02 DIAGNOSIS — M9902 Segmental and somatic dysfunction of thoracic region: Secondary | ICD-10-CM | POA: Diagnosis not present

## 2023-08-02 DIAGNOSIS — M62830 Muscle spasm of back: Secondary | ICD-10-CM | POA: Diagnosis not present

## 2023-08-02 DIAGNOSIS — M9903 Segmental and somatic dysfunction of lumbar region: Secondary | ICD-10-CM | POA: Diagnosis not present

## 2023-08-30 DIAGNOSIS — M9903 Segmental and somatic dysfunction of lumbar region: Secondary | ICD-10-CM | POA: Diagnosis not present

## 2023-08-30 DIAGNOSIS — M9902 Segmental and somatic dysfunction of thoracic region: Secondary | ICD-10-CM | POA: Diagnosis not present

## 2023-08-30 DIAGNOSIS — M9905 Segmental and somatic dysfunction of pelvic region: Secondary | ICD-10-CM | POA: Diagnosis not present

## 2023-08-30 DIAGNOSIS — M62830 Muscle spasm of back: Secondary | ICD-10-CM | POA: Diagnosis not present

## 2023-10-04 DIAGNOSIS — M62830 Muscle spasm of back: Secondary | ICD-10-CM | POA: Diagnosis not present

## 2023-10-04 DIAGNOSIS — M9905 Segmental and somatic dysfunction of pelvic region: Secondary | ICD-10-CM | POA: Diagnosis not present

## 2023-10-04 DIAGNOSIS — M9902 Segmental and somatic dysfunction of thoracic region: Secondary | ICD-10-CM | POA: Diagnosis not present

## 2023-10-04 DIAGNOSIS — M9903 Segmental and somatic dysfunction of lumbar region: Secondary | ICD-10-CM | POA: Diagnosis not present

## 2023-10-16 NOTE — Progress Notes (Unsigned)
 Cardiology Office Note   Date:  10/18/2023   ID:  Brent Brock, DOB 06/22/1945, MRN [id number]  PCP:  No primary care provider on file.  Cardiologist:   Brent Kennard [place], MD   Chief Complaint  Patient presents with   Coronary Artery Disease      History of Present Illness: Brent Brock is a 78 y.o. male who presents for follow up CAD.  He has with a history of a myocardial infarction in 2000 requiring percutaneous coronary intervention of the LAD ( Brent Brock).  He also has a history of hypertension, hyperlipidemia,  and a former history of tobacco abuse. He is a retired Runner, broadcasting/film/video.   On follow up today he is doing very well. No cardiac symptoms of chest pain, dyspnea, palpitations. 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  BP diary shows excellent BP control. No dizziness. He feels he is doing well.    Past Medical History:  Diagnosis Date   Anxiety    ASCVD (arteriosclerotic cardiovascular disease)     PTCA of LAD in 03/1998; recath a few weeks later-no restenosis; apical MI in 04/1998; restenosis of the LAD-stent placed; rotational atherectomy of first diagonal   BPH (benign prostatic hyperplasia)    Chronic prostatitis    Diverticulosis    Elevated PSA    Enlarged prostate    GERD (gastroesophageal reflux disease)    History of coronary angioplasty    Hyperlipidemia    Hypertension    Nicotine dependence    Past use of tobacco    40 pack years discontinued in 03/1998   Seborrheic keratoses    Spondylolysis     Past Surgical History:  Procedure Laterality Date   COLONOSCOPY     CORONARY STENT PLACEMENT     x 2   POLYPECTOMY  1997   Polyps removed on vocal cords      Current Outpatient Medications  Medication Sig Dispense Refill   amLODipine  (NORVASC ) 5 MG tablet Take 1 tablet (5 mg total) by mouth daily. 90 tablet 3   aspirin EC 81 MG tablet Take 81 mg by mouth daily.     atorvastatin  (LIPITOR) 40 MG tablet  Take 1 tablet (40 mg total) by mouth daily. 90 tablet 3   esomeprazole  (NEXIUM ) 20 MG capsule Take 1 capsule (20 mg total) by mouth daily at 12 noon. 30 capsule 2   finasteride (PROSCAR) 5 MG tablet Take 5 mg by mouth daily.     lisinopril -hydrochlorothiazide  (ZESTORETIC ) 20-12.5 MG tablet Take 1 tablet by mouth daily. 90 tablet 3   metoprolol  succinate (TOPROL -XL) 50 MG 24 hr tablet TAKE ONE (1) TABLET BY MOUTH EVERY DAY 90 tablet 3   nitroGLYCERIN  (NITROSTAT ) 0.4 MG SL tablet Place 1 tablet (0.4 mg total) under the tongue every 5 (five) minutes as needed for chest pain. 25 tablet 3   No current facility-administered medications for this visit.    Allergies:   Pneumovax 23 [pneumococcal vac polyvalent], Yellow jacket venom [bee venom], Penicillins, and Sulfa antibiotics    Social History:  The patient  reports that he quit smoking about 25 years ago. His smoking use included pipe and cigarettes. He started smoking about 63 years ago. He has a 38.2 pack-year smoking history. He has never used smokeless tobacco. He reports current alcohol use. He reports that he does not use drugs.   Family History:  The patient's family history includes Breast  cancer in his daughter; CAD in an other family member; Heart attack in his father and mother; Heart disease in his father.    ROS:  Please see the history of present illness.   Otherwise, review of systems are positive for none.   All other systems are reviewed and negative.    PHYSICAL EXAM: VS:  BP 120/60   Pulse 70   Ht 5' 7 (1.702 m)   Wt 177 lb 9.6 oz (80.6 kg)   SpO2 98%   BMI 27.82 kg/m  , BMI Body mass index is 27.82 kg/m. GEN: Well nourished, well developed, in no acute distress HEENT: normal Neck: no JVD, carotid bruits, or masses Cardiac: RRR; no murmurs, rubs, or gallops,no edema  Respiratory:  clear to auscultation bilaterally, normal work of breathing GI: soft, nontender, nondistended, + BS MS: no deformity or atrophy Skin:  warm and dry, no rash Neuro:  Strength and sensation are intact Psych: euthymic mood, full affect         Recent Labs: 04/29/2023: ALT 18; BUN 19; Creatinine, Ser 1.36; Hemoglobin 15.4; Platelets 172; Potassium 4.5; Sodium 145; TSH 6.090    Lipid Panel    Component Value Date/Time   CHOL 129 04/29/2023 1336   TRIG 140 04/29/2023 1336   TRIG 163 05/19/2009 0000   HDL 39 (L) 04/29/2023 1336   CHOLHDL 3.3 04/29/2023 1336   CHOLHDL 3.3 07/27/2013 1401   VLDL 16 07/27/2013 1401   LDLCALC 65 04/29/2023 1336    Labs dated 06/14/21: cholesterol 130, triglycerides 91, LDL 75. HDL 38. BUN 27, creatinine 1.36. glucose 129- nonfasting. CBC normal.  Dated 05/17/22: cholesterol 124, triglycerides 134, LDL 59. HDL 43. Glucose 118. A1c 6%. Creatinine 1.23. otherwise CMET normal. TSH 6.12. CBC normal.   Wt Readings from Last 3 Encounters:  10/18/23 177 lb 9.6 oz (80.6 kg)  07/29/23 179 lb 3.2 oz (81.3 kg)  05/07/23 178 lb 12.8 oz (81.1 kg)      Other studies Reviewed: Additional studies/ records that were reviewed today include: none. Review of the above records demonstrates: N/A   ASSESSMENT AND PLAN:  1.  CAD:  Remote intervention in setting of MI in 2000 with POBA of LAD.    Remains asymptomatic. Continue ASA, statin, beta blocker and amlodipine    2. Essential hypertension:BP is very well controlled.  He denies dizziness. Continue same   3  Hyperlipidemia  LDL 65.      Current medicines are reviewed at length with the patient today.  The patient does not have concerns regarding medicines.  The following changes have been made:  no change  Labs/ tests ordered today include:   No orders of the defined types were placed in this encounter.        Disposition:   FU with me in 6 months  Signed, Brent Gullikson [place], MD  10/18/2023 9:45 AM    [facility] [address], [place], [place], [address] Phone [phone], Fax [phone]

## 2023-10-18 ENCOUNTER — Encounter: Payer: Self-pay | Admitting: Cardiology

## 2023-10-18 ENCOUNTER — Ambulatory Visit: Attending: Cardiology | Admitting: Cardiology

## 2023-10-18 VITALS — BP 120/60 | HR 70 | Ht 67.0 in | Wt 177.6 lb

## 2023-10-18 DIAGNOSIS — I10 Essential (primary) hypertension: Secondary | ICD-10-CM | POA: Diagnosis not present

## 2023-10-18 DIAGNOSIS — I2510 Atherosclerotic heart disease of native coronary artery without angina pectoris: Secondary | ICD-10-CM

## 2023-10-18 DIAGNOSIS — E785 Hyperlipidemia, unspecified: Secondary | ICD-10-CM

## 2023-10-18 NOTE — Patient Instructions (Signed)

## 2023-10-21 ENCOUNTER — Other Ambulatory Visit: Payer: Self-pay | Admitting: Cardiology

## 2023-10-28 ENCOUNTER — Ambulatory Visit

## 2023-10-28 VITALS — BP 104/72 | HR 79 | Ht 67.0 in | Wt 178.0 lb

## 2023-10-28 DIAGNOSIS — R7303 Prediabetes: Secondary | ICD-10-CM

## 2023-10-28 DIAGNOSIS — N1831 Chronic kidney disease, stage 3a: Secondary | ICD-10-CM

## 2023-10-28 DIAGNOSIS — K219 Gastro-esophageal reflux disease without esophagitis: Secondary | ICD-10-CM | POA: Diagnosis not present

## 2023-10-28 MED ORDER — ESOMEPRAZOLE MAGNESIUM 40 MG PO CPDR: 40.0000 mg | DELAYED_RELEASE_CAPSULE | Freq: Every day | ORAL | 11 refills | Status: AC

## 2023-10-28 NOTE — Progress Notes (Signed)
 Established Patient Office Visit  Subjective   Patient ID: Brent Brock, male    DOB: 05-06-1945  Age: 78 y.o. MRN: [id number]  Chief Complaint  Patient presents with   Medical Management of Chronic Issues    Follow up    HPI  Patient Active Problem List   Diagnosis Date Noted   Chronic kidney disease, stage 3a (HCC) 05/27/2023   Vitamin D  insufficiency 05/27/2023   Encounter for well adult exam with abnormal findings 04/29/2023   Elevated TSH 08/06/2022   Prediabetes 08/06/2022   BPH (benign prostatic hyperplasia) 08/06/2022   Decreased GFR 08/06/2022   Tobacco abuse, in remission    Hyperlipidemia 06/02/2009   Anxiety 06/02/2009   Hypertension 06/02/2009   Arteriosclerotic cardiovascular disease (ASCVD) 06/02/2009   Gastroesophageal reflux 06/02/2009    ROS    Objective:     BP 104/72   Pulse 79   Ht 5' 7 (1.702 m)   Wt 178 lb 0.6 oz (80.8 kg)   SpO2 95%   BMI 27.89 kg/m  BP Readings from Last 3 Encounters:  10/28/23 104/72  10/18/23 120/60  07/29/23 103/69   Wt Readings from Last 3 Encounters:  10/28/23 178 lb 0.6 oz (80.8 kg)  10/18/23 177 lb 9.6 oz (80.6 kg)  07/29/23 179 lb 3.2 oz (81.3 kg)     Physical Exam Vitals and nursing note reviewed.  Constitutional:      Appearance: Normal appearance.  HENT:     Head: Normocephalic.     Right Ear: Tympanic membrane, ear canal and external ear normal.     Left Ear: Tympanic membrane, ear canal and external ear normal.     Nose: Nose normal.     Mouth/Throat:     Mouth: Mucous membranes are moist.     Pharynx: Oropharynx is clear.  Eyes:     Extraocular Movements: Extraocular movements intact.     Pupils: Pupils are equal, round, and reactive to light.  Cardiovascular:     Rate and Rhythm: Normal rate and regular rhythm.  Pulmonary:     Effort: Pulmonary effort is normal.     Breath sounds: Normal breath sounds.  Musculoskeletal:     Cervical back: Normal range of motion and neck supple.   Neurological:     Mental Status: He is alert and oriented to person, place, and time.  Psychiatric:        Mood and Affect: Mood normal.        Thought Content: Thought content normal.        The ASCVD Risk score (Arnett DK, et al., 2019) failed to calculate for the following reasons:   The valid total cholesterol range is 130 to 320 mg/dL    Assessment & Plan:   Problem List Items Addressed This Visit       Digestive   Gastroesophageal reflux - Primary   Presenting today for an acute visit requesting medication refills.  His symptoms are not adequately controled with Nexium  20 mg, therefore we increased to 40 mg Nexium .       Relevant Medications   esomeprazole  (NEXIUM ) 40 MG capsule     Genitourinary   Chronic kidney disease, stage 3a (HCC)   Currently prescribed lisinopril .  Renal function has remained stable over the last year.  No additional medication changes are indicated at this time.  Will recheck BMP today.      Relevant Orders   Basic Metabolic Panel (BMET) (Completed)     Other  Prediabetes   A1c 6.1 on labs from February 2025.  He will continue to focus on dietary changes in an effort to lower his blood sugar.  We will recheck A1c level today.        Relevant Orders   Basic Metabolic Panel (BMET) (Completed)   HgB A1c (Completed)    Return in about 3 months (around 01/28/2024) for for yearly physical.    Leita Longs, FNP

## 2023-10-29 LAB — BASIC METABOLIC PANEL WITH GFR
BUN/Creatinine Ratio: 13 (ref 10–24)
BUN: 17 mg/dL (ref 8–27)
CO2: 23 mmol/L (ref 20–29)
Calcium: 9.7 mg/dL (ref 8.6–10.2)
Chloride: 101 mmol/L (ref 96–106)
Creatinine, Ser: 1.31 mg/dL — ABNORMAL HIGH (ref 0.76–1.27)
Glucose: 95 mg/dL (ref 70–99)
Potassium: 4.1 mmol/L (ref 3.5–5.2)
Sodium: 139 mmol/L (ref 134–144)
eGFR: 56 mL/min/1.73 — ABNORMAL LOW (ref >59)

## 2023-10-29 LAB — HEMOGLOBIN A1C
Est. average glucose Bld gHb Est-mCnc: 123 mg/dL
Hgb A1c MFr Bld: 5.9 % — ABNORMAL HIGH (ref 4.8–5.6)

## 2023-11-01 DIAGNOSIS — M62830 Muscle spasm of back: Secondary | ICD-10-CM | POA: Diagnosis not present

## 2023-11-01 DIAGNOSIS — M9903 Segmental and somatic dysfunction of lumbar region: Secondary | ICD-10-CM | POA: Diagnosis not present

## 2023-11-01 DIAGNOSIS — M9902 Segmental and somatic dysfunction of thoracic region: Secondary | ICD-10-CM | POA: Diagnosis not present

## 2023-11-01 DIAGNOSIS — M9905 Segmental and somatic dysfunction of pelvic region: Secondary | ICD-10-CM | POA: Diagnosis not present

## 2023-11-04 ENCOUNTER — Ambulatory Visit: Payer: Medicare PPO | Admitting: Internal Medicine

## 2023-11-07 ENCOUNTER — Ambulatory Visit: Payer: Self-pay

## 2023-11-07 NOTE — Assessment & Plan Note (Signed)
 Presenting today for an acute visit requesting medication refills.  His symptoms are not adequately controled with Nexium  20 mg, therefore we increased to 40 mg Nexium .

## 2023-11-07 NOTE — Assessment & Plan Note (Signed)
 A1c 6.1 on labs from February 2025.  He will continue to focus on dietary changes in an effort to lower his blood sugar.  We will recheck A1c level today.

## 2023-11-07 NOTE — Assessment & Plan Note (Signed)
 Currently prescribed lisinopril .  Renal function has remained stable over the last year.  No additional medication changes are indicated at this time.  Will recheck BMP today.

## 2023-11-08 ENCOUNTER — Telehealth: Payer: Self-pay

## 2023-11-08 DIAGNOSIS — M25562 Pain in left knee: Secondary | ICD-10-CM | POA: Diagnosis not present

## 2023-11-08 DIAGNOSIS — M25561 Pain in right knee: Secondary | ICD-10-CM | POA: Diagnosis not present

## 2023-11-08 DIAGNOSIS — M170 Bilateral primary osteoarthritis of knee: Secondary | ICD-10-CM | POA: Diagnosis not present

## 2023-11-08 DIAGNOSIS — R262 Difficulty in walking, not elsewhere classified: Secondary | ICD-10-CM | POA: Diagnosis not present

## 2023-11-08 NOTE — Telephone Encounter (Signed)
 Called to inform pt that His labs show some improvement in blood sugar levels. Kidney function is stable. Continue to work on low carbohydrate diet and regular exercise. Also recommend avoiding use of NSAIDs.

## 2023-11-15 DIAGNOSIS — M25561 Pain in right knee: Secondary | ICD-10-CM | POA: Diagnosis not present

## 2023-11-15 DIAGNOSIS — M170 Bilateral primary osteoarthritis of knee: Secondary | ICD-10-CM | POA: Diagnosis not present

## 2023-11-15 DIAGNOSIS — M25562 Pain in left knee: Secondary | ICD-10-CM | POA: Diagnosis not present

## 2023-11-15 DIAGNOSIS — R262 Difficulty in walking, not elsewhere classified: Secondary | ICD-10-CM | POA: Diagnosis not present

## 2023-11-18 ENCOUNTER — Telehealth: Payer: Self-pay

## 2023-11-18 NOTE — Telephone Encounter (Signed)
 Called pt to let him know that Leita states to continue with 81 mg Aspirin, but avoid use of ibuprofen, Advil or Aleve.

## 2023-11-22 DIAGNOSIS — M25562 Pain in left knee: Secondary | ICD-10-CM | POA: Diagnosis not present

## 2023-11-22 DIAGNOSIS — M170 Bilateral primary osteoarthritis of knee: Secondary | ICD-10-CM | POA: Diagnosis not present

## 2023-11-22 DIAGNOSIS — M25561 Pain in right knee: Secondary | ICD-10-CM | POA: Diagnosis not present

## 2023-11-22 DIAGNOSIS — R262 Difficulty in walking, not elsewhere classified: Secondary | ICD-10-CM | POA: Diagnosis not present

## 2023-11-26 ENCOUNTER — Telehealth: Payer: Self-pay

## 2023-11-26 NOTE — Telephone Encounter (Signed)
 Error

## 2023-11-27 ENCOUNTER — Other Ambulatory Visit: Payer: Self-pay | Admitting: Cardiology

## 2023-11-27 DIAGNOSIS — R972 Elevated prostate specific antigen [PSA]: Secondary | ICD-10-CM | POA: Diagnosis not present

## 2023-11-28 ENCOUNTER — Telehealth: Payer: Self-pay | Admitting: Cardiology

## 2023-11-28 ENCOUNTER — Other Ambulatory Visit: Payer: Self-pay | Admitting: Cardiology

## 2023-11-28 MED ORDER — LISINOPRIL-HYDROCHLOROTHIAZIDE 20-12.5 MG PO TABS: 1.0000 | ORAL_TABLET | Freq: Every day | ORAL | 3 refills | Status: DC

## 2023-11-28 MED ORDER — AMLODIPINE BESYLATE 5 MG PO TABS: 5.0000 mg | ORAL_TABLET | Freq: Every day | ORAL | 3 refills | Status: AC

## 2023-11-28 NOTE — Telephone Encounter (Signed)
Pt's medications were resent to pt's pharmacy as requested. Confirmation received.  °

## 2023-11-28 NOTE — Telephone Encounter (Signed)
 Spoke to patient he stated refills already sent in.Everything taken care of.

## 2023-11-28 NOTE — Telephone Encounter (Signed)
 Pt c/o medication issue:  1. Name of Medication: metoprolol  succinate (TOPROL -XL) 50 MG 24 hr tablet   amLODipine  (NORVASC ) 5 MG tablet  atorvastatin  (LIPITOR) 40 MG tablet  lisinopril -hydrochlorothiazide  (ZESTORETIC ) 20-12.5 MG tablet  2. How are you currently taking this medication (dosage and times per day)? As Written  3. Are you having a reaction (difficulty breathing--STAT)? No  4. What is your medication issue? Patient is calling because the pharmacy stated they did not receive the medication refill request from us . Informed the patient that the Lisinopril  was sent at 11:42 AM today. Patient is requesting to speak to the nurse in regard to having the medication sent to [facility].

## 2023-11-29 DIAGNOSIS — M25561 Pain in right knee: Secondary | ICD-10-CM | POA: Diagnosis not present

## 2023-11-29 DIAGNOSIS — R262 Difficulty in walking, not elsewhere classified: Secondary | ICD-10-CM | POA: Diagnosis not present

## 2023-11-29 DIAGNOSIS — M25562 Pain in left knee: Secondary | ICD-10-CM | POA: Diagnosis not present

## 2023-11-29 DIAGNOSIS — M170 Bilateral primary osteoarthritis of knee: Secondary | ICD-10-CM | POA: Diagnosis not present

## 2023-11-29 DIAGNOSIS — M9903 Segmental and somatic dysfunction of lumbar region: Secondary | ICD-10-CM | POA: Diagnosis not present

## 2023-11-29 DIAGNOSIS — M9902 Segmental and somatic dysfunction of thoracic region: Secondary | ICD-10-CM | POA: Diagnosis not present

## 2023-11-29 DIAGNOSIS — M62830 Muscle spasm of back: Secondary | ICD-10-CM | POA: Diagnosis not present

## 2023-11-29 DIAGNOSIS — M9905 Segmental and somatic dysfunction of pelvic region: Secondary | ICD-10-CM | POA: Diagnosis not present

## 2023-12-04 DIAGNOSIS — R3912 Poor urinary stream: Secondary | ICD-10-CM | POA: Diagnosis not present

## 2023-12-04 DIAGNOSIS — N401 Enlarged prostate with lower urinary tract symptoms: Secondary | ICD-10-CM | POA: Diagnosis not present

## 2023-12-04 DIAGNOSIS — R972 Elevated prostate specific antigen [PSA]: Secondary | ICD-10-CM | POA: Diagnosis not present

## 2023-12-06 DIAGNOSIS — M25562 Pain in left knee: Secondary | ICD-10-CM | POA: Diagnosis not present

## 2023-12-06 DIAGNOSIS — M1712 Unilateral primary osteoarthritis, left knee: Secondary | ICD-10-CM | POA: Diagnosis not present

## 2023-12-06 DIAGNOSIS — R262 Difficulty in walking, not elsewhere classified: Secondary | ICD-10-CM | POA: Diagnosis not present

## 2023-12-06 DIAGNOSIS — M25561 Pain in right knee: Secondary | ICD-10-CM | POA: Diagnosis not present

## 2023-12-06 DIAGNOSIS — M25662 Stiffness of left knee, not elsewhere classified: Secondary | ICD-10-CM | POA: Diagnosis not present

## 2023-12-06 DIAGNOSIS — M25661 Stiffness of right knee, not elsewhere classified: Secondary | ICD-10-CM | POA: Diagnosis not present

## 2023-12-06 DIAGNOSIS — M170 Bilateral primary osteoarthritis of knee: Secondary | ICD-10-CM | POA: Diagnosis not present

## 2023-12-13 DIAGNOSIS — R262 Difficulty in walking, not elsewhere classified: Secondary | ICD-10-CM | POA: Diagnosis not present

## 2023-12-13 DIAGNOSIS — M25561 Pain in right knee: Secondary | ICD-10-CM | POA: Diagnosis not present

## 2023-12-13 DIAGNOSIS — M25562 Pain in left knee: Secondary | ICD-10-CM | POA: Diagnosis not present

## 2023-12-13 DIAGNOSIS — M170 Bilateral primary osteoarthritis of knee: Secondary | ICD-10-CM | POA: Diagnosis not present

## 2023-12-20 DIAGNOSIS — R262 Difficulty in walking, not elsewhere classified: Secondary | ICD-10-CM | POA: Diagnosis not present

## 2023-12-20 DIAGNOSIS — M25562 Pain in left knee: Secondary | ICD-10-CM | POA: Diagnosis not present

## 2023-12-20 DIAGNOSIS — M25561 Pain in right knee: Secondary | ICD-10-CM | POA: Diagnosis not present

## 2023-12-20 DIAGNOSIS — M170 Bilateral primary osteoarthritis of knee: Secondary | ICD-10-CM | POA: Diagnosis not present

## 2023-12-25 ENCOUNTER — Other Ambulatory Visit: Payer: Self-pay

## 2023-12-27 DIAGNOSIS — M9905 Segmental and somatic dysfunction of pelvic region: Secondary | ICD-10-CM | POA: Diagnosis not present

## 2023-12-27 DIAGNOSIS — M62830 Muscle spasm of back: Secondary | ICD-10-CM | POA: Diagnosis not present

## 2023-12-27 DIAGNOSIS — M9902 Segmental and somatic dysfunction of thoracic region: Secondary | ICD-10-CM | POA: Diagnosis not present

## 2023-12-27 DIAGNOSIS — M9903 Segmental and somatic dysfunction of lumbar region: Secondary | ICD-10-CM | POA: Diagnosis not present

## 2024-01-05 ENCOUNTER — Other Ambulatory Visit: Payer: Self-pay

## 2024-01-05 ENCOUNTER — Emergency Department (HOSPITAL_COMMUNITY)
Admission: EM | Admit: 2024-01-05 | Discharge: 2024-01-05 | Disposition: A | Discharge status: Home / Self Care | Attending: Emergency Medicine | Admitting: Emergency Medicine

## 2024-01-05 ENCOUNTER — Encounter (HOSPITAL_COMMUNITY): Payer: Self-pay

## 2024-01-05 DIAGNOSIS — T63441A Toxic effect of venom of bees, accidental (unintentional), initial encounter: Secondary | ICD-10-CM | POA: Insufficient documentation

## 2024-01-05 DIAGNOSIS — N189 Chronic kidney disease, unspecified: Secondary | ICD-10-CM | POA: Insufficient documentation

## 2024-01-05 DIAGNOSIS — Z79899 Other long term (current) drug therapy: Secondary | ICD-10-CM | POA: Diagnosis not present

## 2024-01-05 DIAGNOSIS — Z7982 Long term (current) use of aspirin: Secondary | ICD-10-CM | POA: Diagnosis not present

## 2024-01-05 DIAGNOSIS — I129 Hypertensive chronic kidney disease with stage 1 through stage 4 chronic kidney disease, or unspecified chronic kidney disease: Secondary | ICD-10-CM | POA: Insufficient documentation

## 2024-01-05 DIAGNOSIS — I2510 Atherosclerotic heart disease of native coronary artery without angina pectoris: Secondary | ICD-10-CM | POA: Diagnosis not present

## 2024-01-05 DIAGNOSIS — T7840XA Allergy, unspecified, initial encounter: Secondary | ICD-10-CM | POA: Insufficient documentation

## 2024-01-05 DIAGNOSIS — R229 Localized swelling, mass and lump, unspecified: Secondary | ICD-10-CM | POA: Diagnosis not present

## 2024-01-05 LAB — CBC
HCT: 45.8 % (ref 39.0–52.0)
Hemoglobin: 16 g/dL (ref 13.0–17.0)
MCH: 30.2 pg (ref 26.0–34.0)
MCHC: 34.9 g/dL (ref 30.0–36.0)
MCV: 86.6 fL (ref 80.0–100.0)
Platelets: 143 K/uL — ABNORMAL LOW (ref 150–400)
RBC: 5.29 MIL/uL (ref 4.22–5.81)
RDW: 13.3 % (ref 11.5–15.5)
WBC: 6.8 K/uL (ref 4.0–10.5)
nRBC: 0 % (ref 0.0–0.2)

## 2024-01-05 LAB — BASIC METABOLIC PANEL WITH GFR
Anion gap: 13 (ref 5–15)
BUN: 15 mg/dL (ref 8–23)
CO2: 23 mmol/L (ref 22–32)
Calcium: 9.5 mg/dL (ref 8.9–10.3)
Chloride: 103 mmol/L (ref 98–111)
Creatinine, Ser: 1.3 mg/dL — ABNORMAL HIGH (ref 0.61–1.24)
GFR, Estimated: 56 mL/min — ABNORMAL LOW (ref >60)
Glucose, Bld: 129 mg/dL — ABNORMAL HIGH (ref 70–99)
Potassium: 3.8 mmol/L (ref 3.5–5.1)
Sodium: 139 mmol/L (ref 135–145)

## 2024-01-05 MED ORDER — ACETAMINOPHEN 500 MG PO TABS
1000.0000 mg | ORAL_TABLET | Freq: Once | ORAL | Status: AC
Start: 2024-01-05 — End: 2024-01-05
  Administered 2024-01-05: 1000 mg via ORAL
  Filled 2024-01-05: qty 2

## 2024-01-05 MED ORDER — DIPHENHYDRAMINE HCL 50 MG/ML IJ SOLN
25.0000 mg | Freq: Once | INTRAMUSCULAR | Status: AC
  Administered 2024-01-05: 25 mg via INTRAVENOUS
  Filled 2024-01-05: qty 1

## 2024-01-05 MED ORDER — FAMOTIDINE IN NACL 20-0.9 MG/50ML-% IV SOLN
20.0000 mg | Freq: Once | INTRAVENOUS | Status: AC
  Administered 2024-01-05: 20 mg via INTRAVENOUS
  Filled 2024-01-05: qty 50

## 2024-01-05 MED ORDER — DEXAMETHASONE SOD PHOSPHATE PF 10 MG/ML IJ SOLN
10.0000 mg | Freq: Once | INTRAMUSCULAR | Status: AC
  Administered 2024-01-05: 10 mg via INTRAVENOUS

## 2024-01-05 NOTE — ED Triage Notes (Signed)
 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 Pain 9/10 Stings noted on body. Patient ambulated from waiting area.

## 2024-01-05 NOTE — Discharge Instructions (Signed)
 Please continue to ice the areas that you are stung in and washed with soap and water.  Tylenol for pain and ibuprofen for itching.  Follow-up with your regular doctor.  Return to the Emergency Department if any worsening or concerning symptoms.

## 2024-01-05 NOTE — ED Provider Notes (Signed)
 [place] EMERGENCY DEPARTMENT AT [facility] Provider Note   CSN: [id number] Arrival date & time: 01/05/24  1127     Patient presents with: Allergic Reaction (Bee stings)   Brent Brock is a 78 y.o. male.  He has a history of hypertension, coronary disease, CKD.  He was stung multiple times by bees about an hour ago.  Complaining of pain at the site and swelling.  No difficulty breathing.  No chest pain or shortness of breath.  No difficulty speaking or swallowing.  Has tried nothing for his symptoms.  Has not had a bee sting for over 20 years but had a significant reaction to them.   The history is provided by the patient.  Allergic Reaction Presenting symptoms: swelling   Severity:  Moderate Duration:  1 hour Prior allergic episodes:  Insect allergies Context: insect bite/sting   Relieved by:  None tried      Prior to Admission medications   Medication Sig Start Date End Date Taking? Authorizing Provider  amLODipine  (NORVASC ) 5 MG tablet Take 1 tablet (5 mg total) by mouth daily. 11/28/23   [place], Peter M, MD  aspirin EC 81 MG tablet Take 81 mg by mouth daily.    [provider]  atorvastatin  (LIPITOR) 40 MG tablet TAKE ONE TABLET (40MG  TOTAL) BY MOUTH DAILY 11/28/23   [place], Peter M, MD  esomeprazole  (NEXIUM ) 40 MG capsule Take 1 capsule (40 mg total) by mouth daily before breakfast. 10/28/23   Bevely Doffing, FNP  finasteride (PROSCAR) 5 MG tablet Take 5 mg by mouth daily. 01/07/20   [provider]  lisinopril -hydrochlorothiazide  (ZESTORETIC ) 20-12.5 MG tablet Take 1 tablet by mouth daily. 11/28/23   [place], Peter M, MD  metoprolol  succinate (TOPROL -XL) 50 MG 24 hr tablet TAKE ONE (1) TABLET BY MOUTH EVERY DAY 11/28/23   [place], Peter M, MD  nitroGLYCERIN  (NITROSTAT ) 0.4 MG SL tablet Place 1 tablet (0.4 mg total) under the tongue every 5 (five) minutes as needed for chest pain. 10/18/22   [place], Peter M, MD    Allergies: Pneumovax 23 [pneumococcal  vac polyvalent], Yellow jacket venom [bee venom], Penicillins, and Sulfa antibiotics    Review of Systems  Constitutional:  Negative for fever.  HENT:  Negative for sore throat.   Respiratory:  Negative for shortness of breath.   Cardiovascular:  Negative for chest pain.  Gastrointestinal:  Negative for abdominal pain.  Genitourinary:  Negative for dysuria.    Updated Vital Signs BP (!) 133/97   Pulse 88   Ht 5' 7 (1.702 m)   Wt 80.7 kg   SpO2 94%   BMI 27.88 kg/m   Physical Exam Vitals and nursing note reviewed.  Constitutional:      General: He is not in acute distress.    Appearance: Normal appearance. He is well-developed.  HENT:     Head: Normocephalic and atraumatic.  Eyes:     Conjunctiva/sclera: Conjunctivae normal.  Cardiovascular:     Rate and Rhythm: Normal rate and regular rhythm.     Heart sounds: No murmur heard. Pulmonary:     Effort: Pulmonary effort is normal. No respiratory distress.     Breath sounds: Normal breath sounds.  Abdominal:     Palpations: Abdomen is soft.     Tenderness: There is no abdominal tenderness.  Musculoskeletal:        General: No swelling.     Cervical back: Neck supple.  Skin:    General: Skin is warm  and dry.     Capillary Refill: Capillary refill takes less than 2 seconds.     Comments: He has multiple sites of sting with localized swelling and redness.  No diffuse urticaria.  Neurological:     General: No focal deficit present.     Mental Status: He is alert.     Gait: Gait normal.     (all labs ordered are listed, but only abnormal results are displayed) Labs Reviewed  BASIC METABOLIC PANEL WITH GFR - Abnormal; Notable for the following components:      Result Value   Glucose, Bld 129 (*)    Creatinine, Ser 1.30 (*)    GFR, Estimated 56 (*)    All other components within normal limits  CBC - Abnormal; Notable for the following components:   Platelets 143 (*)    All other components within normal limits     EKG: None  Radiology: No results found.   Procedures   Medications Ordered in the ED  diphenhydrAMINE  (BENADRYL ) injection 25 mg (has no administration in time range)  dexamethasone (DECADRON) injection 10 mg (has no administration in time range)  famotidine  (PEPCID ) IVPB 20 mg premix (has no administration in time range)    Clinical Course as of 01/05/24 1705  Brent Brock  1306 Patient is feeling a little bit better.  Still having some local pain at the site of stainings and some local swelling.  He said he does not want to take NSAIDs.  Will prescribe some Tylenol [MB]  1417 Patient has remained hemodynamically stable with just local reaction.  He is comfortable plan for discharge.  Return instructions discussed [MB]    Clinical Course User Index [MB] [name], MD                                 Medical Decision Making Amount and/or Complexity of Data Reviewed Labs: ordered.  Risk OTC drugs. Prescription drug management.   This patient complains of bee stings all over, generalized pain, anxiety; this involves an extensive number of treatment Options and is a complaint that carries with it a high risk of complications and morbidity. The differential includes allergic reaction, anaphylaxis  I ordered, reviewed and interpreted labs, which included CBC normal although mildly low platelets, chemistries with elevated glucose and creatinine I ordered medication IV Benadryl  steroids and Pepcid  and reviewed PMP when indicated. Additional history obtained from patient's family member Previous records obtained and reviewed in epic including outpatient cardiology notes Cardiac monitoring reviewed, sinus bradycardia Social determinants considered, no significant barriers Critical Interventions: None  After the interventions stated above, I reevaluated the patient and found patient to be feeling better and hemodynamically stable Admission and further testing  considered, no indications for admission or further workup at this time.  He is comfortable plan for discharge and symptomatic treatment.  Return instructions discussed      Final diagnoses:  Allergic reaction, initial encounter  Bee sting, accidental or unintentional, initial encounter    ED Discharge Orders     None          [name], MD 01/05/24 [phone]

## 2024-01-08 DIAGNOSIS — H2513 Age-related nuclear cataract, bilateral: Secondary | ICD-10-CM | POA: Diagnosis not present

## 2024-01-08 DIAGNOSIS — H52203 Unspecified astigmatism, bilateral: Secondary | ICD-10-CM | POA: Diagnosis not present

## 2024-01-08 DIAGNOSIS — H25043 Posterior subcapsular polar age-related cataract, bilateral: Secondary | ICD-10-CM | POA: Diagnosis not present

## 2024-01-24 DIAGNOSIS — M9902 Segmental and somatic dysfunction of thoracic region: Secondary | ICD-10-CM | POA: Diagnosis not present

## 2024-01-24 DIAGNOSIS — M9905 Segmental and somatic dysfunction of pelvic region: Secondary | ICD-10-CM | POA: Diagnosis not present

## 2024-01-24 DIAGNOSIS — M62830 Muscle spasm of back: Secondary | ICD-10-CM | POA: Diagnosis not present

## 2024-01-24 DIAGNOSIS — M9903 Segmental and somatic dysfunction of lumbar region: Secondary | ICD-10-CM | POA: Diagnosis not present

## 2024-02-10 ENCOUNTER — Ambulatory Visit

## 2024-02-10 VITALS — BP 114/84 | HR 61 | Ht 67.0 in | Wt 180.1 lb

## 2024-02-10 DIAGNOSIS — Z0000 Encounter for general adult medical examination without abnormal findings: Secondary | ICD-10-CM

## 2024-02-10 DIAGNOSIS — Z0001 Encounter for general adult medical examination with abnormal findings: Secondary | ICD-10-CM | POA: Diagnosis not present

## 2024-02-10 DIAGNOSIS — E559 Vitamin D deficiency, unspecified: Secondary | ICD-10-CM | POA: Diagnosis not present

## 2024-02-10 DIAGNOSIS — L821 Other seborrheic keratosis: Secondary | ICD-10-CM | POA: Insufficient documentation

## 2024-02-10 DIAGNOSIS — Z91030 Bee allergy status: Secondary | ICD-10-CM | POA: Diagnosis not present

## 2024-02-10 DIAGNOSIS — R21 Rash and other nonspecific skin eruption: Secondary | ICD-10-CM

## 2024-02-10 DIAGNOSIS — R7303 Prediabetes: Secondary | ICD-10-CM

## 2024-02-10 MED ORDER — EPINEPHRINE 0.3 MG/0.3ML IJ SOAJ: 0.3000 mg | INTRAMUSCULAR | 5 refills | Status: AC | PRN

## 2024-02-10 MED ORDER — BETAMETHASONE DIPROPIONATE 0.05 % EX CREA: TOPICAL_CREAM | Freq: Two times a day (BID) | CUTANEOUS | 5 refills | Status: AC

## 2024-02-10 NOTE — Progress Notes (Unsigned)
 Complete physical exam  Patient: Brent Brock   DOB: Feb 07, 1946   78 y.o. Male  MRN: [id number]  Subjective:    Chief Complaint  Patient presents with   Medical Management of Chronic Issues    Physical     Brent Brock is a 78 y.o. male who presents today for a complete physical exam. He reports consuming a {diet types:17450} diet. {types:19826} He generally feels {DESC; WELL/FAIRLY WELL/POORLY:18703}. He reports sleeping {DESC; WELL/FAIRLY WELL/POORLY:18703}. He {does/does not:200015} have additional problems to discuss today.    Most recent fall risk assessment:    10/28/2023    2:33 PM  Fall Risk   Falls in the past year? 0  Risk for fall due to : No Fall Risks  Follow up Falls evaluation completed     Most recent depression screenings:    02/10/2024    3:14 PM 10/28/2023    2:33 PM  PHQ 2/9 Scores  PHQ - 2 Score 0 0  PHQ- 9 Score 1 2      Data saved with a previous flowsheet row definition    {VISON DENTAL STD PSA (Optional):27386}  {History (Optional):23778}  Patient Care Team: Bevely Doffing, FNP as PCP - General (Family Medicine) Jordan, Peter M, MD as PCP - Cardiology (Cardiology) Renda Glance, MD as Consulting Physician (Urology) Legrand Victory LITTIE MOULD, MD as Consulting Physician (Gastroenterology)   Outpatient Medications Prior to Visit  Medication Sig   amLODipine  (NORVASC ) 5 MG tablet Take 1 tablet (5 mg total) by mouth daily.   aspirin EC 81 MG tablet Take 81 mg by mouth daily.   atorvastatin  (LIPITOR) 40 MG tablet TAKE ONE TABLET (40MG  TOTAL) BY MOUTH DAILY   esomeprazole  (NEXIUM ) 40 MG capsule Take 1 capsule (40 mg total) by mouth daily before breakfast.   finasteride (PROSCAR) 5 MG tablet Take 5 mg by mouth daily.   lisinopril -hydrochlorothiazide  (ZESTORETIC ) 20-12.5 MG tablet Take 1 tablet by mouth daily.   metoprolol  succinate (TOPROL -XL) 50 MG 24 hr tablet TAKE ONE (1) TABLET BY MOUTH EVERY DAY   nitroGLYCERIN  (NITROSTAT ) 0.4 MG SL tablet  Place 1 tablet (0.4 mg total) under the tongue every 5 (five) minutes as needed for chest pain.   No facility-administered medications prior to visit.    ROS        Objective:     BP 114/84   Pulse 61   Ht 5' 7 (1.702 m)   Wt 180 lb 1.3 oz (81.7 kg)   SpO2 96%   BMI 28.20 kg/m  BP Readings from Last 3 Encounters:  02/10/24 114/84  01/05/24 108/77  10/28/23 104/72   Wt Readings from Last 3 Encounters:  02/10/24 180 lb 1.3 oz (81.7 kg)  01/05/24 178 lb (80.7 kg)  10/28/23 178 lb 0.6 oz (80.8 kg)      Physical Exam   No results found for any visits on 02/10/24. {Show previous labs (optional):23779}    Assessment & Plan:    Routine Health Maintenance and Physical Exam  Immunization History  Administered Date(s) Administered   Fluad Quad(high Dose 65+) 11/25/2018, 12/25/2022   INFLUENZA, HIGH DOSE SEASONAL PF 11/12/2016   Influenza,inj,quad, With Preservative 11/26/2016   Influenza-Unspecified 12/24/2023   Moderna Sars-Covid-2 Vaccination 04/28/2019   Pneumococcal Conjugate-13 02/09/2014   Pneumococcal Polysaccharide-23 07/15/2015   RSV,unspecified 12/24/2023   Zoster Recombinant(Shingrix) 11/12/2016, 01/14/2017    Health Maintenance  Topic Date Due   Medicare Annual Wellness (AWV)  Never done   DTaP/Tdap/Td (1 -  Tdap) Never done   COVID-19 Vaccine (2 - Moderna risk series) 05/26/2019   Pneumococcal Vaccine: 50+ Years  Completed   Influenza Vaccine  Completed   Hepatitis C Screening  Completed   Zoster Vaccines- Shingrix  Completed   Meningococcal B Vaccine  Aged Out    Discussed health benefits of physical activity, and encouraged him to engage in regular exercise appropriate for his age and condition.  Problem List Items Addressed This Visit   None  No follow-ups on file.     Leita Longs, FNP

## 2024-02-11 NOTE — Assessment & Plan Note (Signed)
 Daily vitamin D  supplementation recommended.  Update vitamin D  level

## 2024-02-11 NOTE — Assessment & Plan Note (Signed)
 He will continue to focus on dietary changes in an effort to lower his blood sugar.  We will recheck A1c level today.

## 2024-02-12 DIAGNOSIS — Z0000 Encounter for general adult medical examination without abnormal findings: Secondary | ICD-10-CM | POA: Diagnosis not present

## 2024-02-12 DIAGNOSIS — E559 Vitamin D deficiency, unspecified: Secondary | ICD-10-CM | POA: Diagnosis not present

## 2024-02-12 DIAGNOSIS — R7303 Prediabetes: Secondary | ICD-10-CM | POA: Diagnosis not present

## 2024-02-13 LAB — CMP14+EGFR
ALT: 19 IU/L (ref 0–44)
AST: 23 IU/L (ref 0–40)
Albumin: 4.1 g/dL (ref 3.8–4.8)
Alkaline Phosphatase: 95 IU/L (ref 47–123)
BUN/Creatinine Ratio: 13 (ref 10–24)
BUN: 19 mg/dL (ref 8–27)
Bilirubin Total: 0.6 mg/dL (ref 0.0–1.2)
CO2: 23 mmol/L (ref 20–29)
Calcium: 9.3 mg/dL (ref 8.6–10.2)
Chloride: 103 mmol/L (ref 96–106)
Creatinine, Ser: 1.43 mg/dL — ABNORMAL HIGH (ref 0.76–1.27)
Globulin, Total: 2.4 g/dL (ref 1.5–4.5)
Glucose: 115 mg/dL — ABNORMAL HIGH (ref 70–99)
Potassium: 3.7 mmol/L (ref 3.5–5.2)
Sodium: 142 mmol/L (ref 134–144)
Total Protein: 6.5 g/dL (ref 6.0–8.5)
eGFR: 50 mL/min/1.73 — ABNORMAL LOW (ref >59)

## 2024-02-13 LAB — CBC WITH DIFFERENTIAL/PLATELET
Basophils Absolute: 0.1 x10E3/uL (ref 0.0–0.2)
Basos: 1 %
EOS (ABSOLUTE): 0.2 x10E3/uL (ref 0.0–0.4)
Eos: 3 %
Hematocrit: 45.3 % (ref 37.5–51.0)
Hemoglobin: 15 g/dL (ref 13.0–17.7)
Immature Grans (Abs): 0 x10E3/uL (ref 0.0–0.1)
Immature Granulocytes: 0 %
Lymphocytes Absolute: 2.7 x10E3/uL (ref 0.7–3.1)
Lymphs: 32 %
MCH: 30.2 pg (ref 26.6–33.0)
MCHC: 33.1 g/dL (ref 31.5–35.7)
MCV: 91 fL (ref 79–97)
Monocytes Absolute: 0.7 x10E3/uL (ref 0.1–0.9)
Monocytes: 8 %
Neutrophils Absolute: 4.9 x10E3/uL (ref 1.4–7.0)
Neutrophils: 56 %
Platelets: 158 x10E3/uL (ref 150–450)
RBC: 4.97 x10E6/uL (ref 4.14–5.80)
RDW: 13.2 % (ref 11.6–15.4)
WBC: 8.5 x10E3/uL (ref 3.4–10.8)

## 2024-02-13 LAB — TSH+FREE T4
Free T4: 1.2 ng/dL (ref 0.82–1.77)
TSH: 6.46 u[IU]/mL — ABNORMAL HIGH (ref 0.450–4.500)

## 2024-02-13 LAB — HEMOGLOBIN A1C
Est. average glucose Bld gHb Est-mCnc: 128 mg/dL
Hgb A1c MFr Bld: 6.1 % — ABNORMAL HIGH (ref 4.8–5.6)

## 2024-02-13 LAB — LIPID PANEL
Chol/HDL Ratio: 3.3 ratio (ref 0.0–5.0)
Cholesterol, Total: 129 mg/dL (ref 100–199)
HDL: 39 mg/dL — ABNORMAL LOW (ref >39)
LDL Chol Calc (NIH): 71 mg/dL (ref 0–99)
Triglycerides: 104 mg/dL (ref 0–149)
VLDL Cholesterol Cal: 19 mg/dL (ref 5–40)

## 2024-02-13 LAB — VITAMIN D 25 HYDROXY (VIT D DEFICIENCY, FRACTURES): Vit D, 25-Hydroxy: 46.3 ng/mL (ref 30.0–100.0)

## 2024-02-25 ENCOUNTER — Ambulatory Visit: Payer: Self-pay | Admitting: Internal Medicine

## 2024-02-25 ENCOUNTER — Encounter: Payer: Self-pay | Admitting: Internal Medicine

## 2024-02-25 VITALS — BP 128/86 | HR 84 | Ht 67.0 in | Wt 181.4 lb

## 2024-02-25 DIAGNOSIS — I10 Essential (primary) hypertension: Secondary | ICD-10-CM

## 2024-02-25 DIAGNOSIS — R7303 Prediabetes: Secondary | ICD-10-CM | POA: Diagnosis not present

## 2024-02-25 DIAGNOSIS — I2510 Atherosclerotic heart disease of native coronary artery without angina pectoris: Secondary | ICD-10-CM | POA: Diagnosis not present

## 2024-02-25 DIAGNOSIS — E782 Mixed hyperlipidemia: Secondary | ICD-10-CM

## 2024-02-25 DIAGNOSIS — K219 Gastro-esophageal reflux disease without esophagitis: Secondary | ICD-10-CM

## 2024-02-25 DIAGNOSIS — N400 Enlarged prostate without lower urinary tract symptoms: Secondary | ICD-10-CM | POA: Diagnosis not present

## 2024-02-25 DIAGNOSIS — Z23 Encounter for immunization: Secondary | ICD-10-CM | POA: Diagnosis not present

## 2024-02-25 DIAGNOSIS — N1831 Chronic kidney disease, stage 3a: Secondary | ICD-10-CM

## 2024-02-25 DIAGNOSIS — E038 Other specified hypothyroidism: Secondary | ICD-10-CM | POA: Diagnosis not present

## 2024-02-25 NOTE — Assessment & Plan Note (Signed)
 Lab Results  Component Value Date   HGBA1C 6.1 (H) 02/12/2024   Advised to follow DASH diet

## 2024-02-25 NOTE — Progress Notes (Signed)
 Established Patient Office Visit  Subjective:  Patient ID: Brent Brock, male    DOB: 1946/02/22  Age: 78 y.o. MRN: [id number]  CC:  Chief Complaint  Patient presents with   Hypertension   Chronic Kidney Disease    HPI Brent Brock is a 78 y.o. male with past medical history of HTN, CAD, BPH, CKD and GERD who presents for f/u of his chronic medical conditions.  HTN and CAD: BP is well-controlled. Takes amlodipine  5 mg QD and lisinopril -HCTZ 20-12.5 mg QD regularly. Patient denies headache, dizziness, chest pain, dyspnea or palpitations.  He takes aspirin and statin for history of CAD.  He also takes metoprolol  50 mg QD.  Followed by cardiology.  CKD: His last BMP showed GFR of 50, usually ranges around 55.  Does not report dysuria, hematuria, urinary hesitancy or resistance currently.  BPH: Takes finasteride for it.  Followed by urology.  GERD: He takes Nexium  40 mg QD.  Does not report nausea, dysphagia, melena or hematochezia.     Past Medical History:  Diagnosis Date   Anxiety    ASCVD (arteriosclerotic cardiovascular disease)     PTCA of LAD in 03/1998; recath a few weeks later-no restenosis; apical MI in 04/1998; restenosis of the LAD-stent placed; rotational atherectomy of first diagonal   BPH (benign prostatic hyperplasia)    Chronic prostatitis    Diverticulosis    Elevated PSA    Enlarged prostate    GERD (gastroesophageal reflux disease)    History of coronary angioplasty    Hyperlipidemia    Hypertension    Nicotine dependence    Past use of tobacco    40 pack years discontinued in 03/1998   Seborrheic keratoses    Spondylolysis     Past Surgical History:  Procedure Laterality Date   COLONOSCOPY     CORONARY STENT PLACEMENT     x 2   POLYPECTOMY  1997   Polyps removed on vocal cords     Family History  Problem Relation Age of Onset   Heart attack Mother    Heart attack Father    Heart disease Father    Breast cancer Daughter        age 14     CAD Other    Colon cancer Neg Hx     Social History   Socioeconomic History   Marital status: Widowed    Spouse name: Not on file   Number of children: Not on file   Years of education: Not on file   Highest education level: Not on file  Occupational History   Occupation: Professor    Comment: [employer]  Tobacco Use   Smoking status: Former    Current packs/day: 0.00    Average packs/day: 1 pack/day for 38.2 years (38.2 ttl pk-yrs)    Types: Pipe, Cigarettes    Start date: 03/26/1960    Quit date: 06/21/1998    Years since quitting: 25.6   Smokeless tobacco: Never  Vaping Use   Vaping status: Never Used  Substance and Sexual Activity   Alcohol use: Yes    Comment: Occas   Drug use: No   Sexual activity: Never  Other Topics Concern   Not on file  Social History Narrative   Lives w/ Wife   Lost only daughter from metastatic breast cancer    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 90 tablet 3   aspirin EC 81 MG tablet Take 81 mg by mouth daily.     atorvastatin  (LIPITOR) 40 MG tablet TAKE ONE TABLET (40MG  TOTAL) BY MOUTH DAILY 90 tablet 3   betamethasone  dipropionate 0.05 % cream Apply topically 2 (two) times daily. 30 g 5   EPINEPHrine  (EPIPEN  2-PAK) 0.3 mg/0.3 mL IJ SOAJ injection Inject 0.3 mg into the muscle as needed for anaphylaxis. 2 each 5   esomeprazole  (NEXIUM ) 40 MG capsule Take 1 capsule (40 mg total) by mouth daily before breakfast. 30 capsule 11   finasteride (PROSCAR) 5 MG tablet Take 5 mg by mouth daily.      lisinopril -hydrochlorothiazide  (ZESTORETIC ) 20-12.5 MG tablet Take 1 tablet by mouth daily. 90 tablet 3   metoprolol  succinate (TOPROL -XL) 50 MG 24 hr tablet TAKE ONE (1) TABLET BY MOUTH EVERY DAY 90 tablet 3   nitroGLYCERIN  (NITROSTAT ) 0.4 MG SL tablet Place 1 tablet (0.4 mg total) under the tongue every 5 (five) minutes as needed for chest pain. 25 tablet 3   No facility-administered medications prior to visit.    Allergies  Allergen Reactions   Pneumovax 23 [Pneumococcal Vac Polyvalent] Swelling   Yellow Jacket Venom [Bee Venom] Swelling   Penicillins Other (See Comments)    Thrush   Sulfa Antibiotics Rash    ROS Review of Systems  Constitutional:  Negative for chills and fever.  HENT:  Negative for congestion and sore throat.   Eyes:  Negative for pain and discharge.  Respiratory:  Negative for cough and shortness of breath.   Cardiovascular:  Negative for chest pain and palpitations.  Gastrointestinal:  Negative for constipation, diarrhea, nausea and vomiting.  Endocrine: Negative for polydipsia and polyuria.  Genitourinary:  Negative for dysuria and hematuria.  Musculoskeletal:  Negative for neck pain and neck stiffness.  Skin:  Negative for rash.  Neurological:  Negative for dizziness, weakness, numbness and headaches.  Psychiatric/Behavioral:  Negative for agitation and behavioral problems.       Objective:    Physical Exam Vitals reviewed.  Constitutional:      General: He is not in acute distress.    Appearance: He is not diaphoretic.  HENT:     Head: Normocephalic and atraumatic.     Nose: Nose normal.     Mouth/Throat:     Mouth: Mucous membranes are moist.  Eyes:     General: No scleral icterus.    Extraocular Movements: Extraocular movements intact.  Cardiovascular:     Rate and Rhythm: Normal rate and regular rhythm.     Heart sounds: Normal heart sounds. No murmur heard. Pulmonary:     Breath sounds: Normal breath sounds. No wheezing or rales.   Musculoskeletal:     Cervical back: Neck supple. No tenderness.     Right lower leg: No edema.     Left lower leg: No edema.  Skin:    General: Skin is warm.     Findings: No rash.  Neurological:     General: No focal deficit present.     Mental Status: He is alert and oriented to person, place, and time.  Psychiatric:  Mood and Affect: Mood normal.        Behavior: Behavior normal.     BP 128/86 (BP Location: Right Arm)   Pulse 84   Ht 5' 7 (1.702 m)   Wt 181 lb 6.4 oz (82.3 kg)   SpO2 99%   BMI 28.41 kg/m  Wt Readings from Last 3 Encounters:  02/25/24 181 lb 6.4 oz (82.3 kg)  02/10/24 180 lb 1.3 oz (81.7 kg)  01/05/24 178 lb (80.7 kg)    Lab Results  Component Value Date   TSH 6.460 (H) 02/12/2024   Lab Results  Component Value Date   WBC 8.5 02/12/2024   HGB 15.0 02/12/2024   HCT 45.3 02/12/2024   MCV 91 02/12/2024   PLT 158 02/12/2024   Lab Results  Component Value Date   NA 142 02/12/2024   K 3.7 02/12/2024   CO2 23 02/12/2024   GLUCOSE 115 (H) 02/12/2024   BUN 19 02/12/2024   CREATININE 1.43 (H) 02/12/2024   BILITOT 0.6 02/12/2024   ALKPHOS 95 02/12/2024   AST 23 02/12/2024   ALT 19 02/12/2024   PROT 6.5 02/12/2024   ALBUMIN 4.1 02/12/2024   CALCIUM  9.3 02/12/2024   ANIONGAP 13 01/05/2024   EGFR 50 (L) 02/12/2024   Lab Results  Component Value Date   CHOL 129 02/12/2024   Lab Results  Component Value Date   HDL 39 (L) 02/12/2024   Lab Results  Component Value Date   LDLCALC 71 02/12/2024   Lab Results  Component Value Date   TRIG 104 02/12/2024   Lab Results  Component Value Date   CHOLHDL 3.3 02/12/2024   Lab Results  Component Value Date   HGBA1C 6.1 (H) 02/12/2024      Assessment & Plan:   Problem List Items Addressed This Visit       Cardiovascular and Mediastinum   Essential hypertension   BP Readings from Last 1 Encounters:  02/25/24 128/86   Well-controlled with amlodipine  5 mg QD, lisinopril -HCTZ  20-12.5 mg QD and metoprolol  50 mg QD Counseled for compliance with the medications Advised DASH diet and moderate exercise/walking, at least 150 mins/week      CAD (coronary artery disease) - Primary   S/p stent placement in 2000 On aspirin, statin and metoprolol  currently Followed by cardiology        Digestive   Gastroesophageal reflux   Well-controlled with Nexium  40 mg QD        Endocrine   Subclinical hypothyroidism   Lab Results  Component Value Date   TSH 6.460 (H) 02/12/2024   Has had similarly elevated TSH in the past as well With free T4 WNL Currently asymptomatic Will continue to monitor TSH and free T4 for now       Relevant Orders   TSH + free T4     Genitourinary   BPH (benign prostatic hyperplasia)   Well-controlled with finasteride 5 mg QD Followed by urology      Chronic kidney disease, stage 3a (HCC)   Last BMP reviewed GFR usually stays around 50-55 Avoid nephrotoxic agents Advised to maintain adequate hydration On lisinopril -HCTZ for HTN      Relevant Orders   CMP14+EGFR   CBC   Protein / creatinine ratio, urine     Other   Hyperlipidemia   On atorvastatin  40 mg QD Last lipid profile reviewed      Prediabetes   Lab Results  Component Value Date   HGBA1C 6.1 (H) 02/12/2024  Advised to follow DASH diet      Relevant Orders   CMP14+EGFR   Hemoglobin A1c   Other Visit Diagnoses       Encounter for immunization       Relevant Orders   Pneumococcal conjugate vaccine 20-valent (Completed)       No orders of the defined types were placed in this encounter.   Follow-up: Return in about 6 months (around 08/25/2024) for HTN and CKD.    Suzzane MARLA Blanch, MD

## 2024-02-25 NOTE — Assessment & Plan Note (Signed)
 Well-controlled with finasteride 5 mg QD Followed by urology

## 2024-02-25 NOTE — Assessment & Plan Note (Signed)
 Lab Results  Component Value Date   TSH 6.460 (H) 02/12/2024   Has had similarly elevated TSH in the past as well With free T4 WNL Currently asymptomatic Will continue to monitor TSH and free T4 for now

## 2024-02-25 NOTE — Assessment & Plan Note (Signed)
 BP Readings from Last 1 Encounters:  02/25/24 128/86   Well-controlled with amlodipine  5 mg QD, lisinopril -HCTZ 20-12.5 mg QD and metoprolol  50 mg QD Counseled for compliance with the medications Advised DASH diet and moderate exercise/walking, at least 150 mins/week

## 2024-02-25 NOTE — Assessment & Plan Note (Signed)
 Well-controlled with Nexium 40 mg QD

## 2024-02-25 NOTE — Patient Instructions (Addendum)
 Schedule your Medicare Annual Wellness Visit at checkout.  Please continue to take medications as prescribed.  Please continue to follow low carb diet and perform moderate exercise/walking as tolerated.  Please get fasting blood tests done before the next visit.

## 2024-02-25 NOTE — Assessment & Plan Note (Signed)
 On atorvastatin  40 mg QD Last lipid profile reviewed

## 2024-02-25 NOTE — Assessment & Plan Note (Signed)
 S/p stent placement in 2000 On aspirin, statin and metoprolol  currently Followed by cardiology

## 2024-02-25 NOTE — Assessment & Plan Note (Signed)
 Last BMP reviewed GFR usually stays around 50-55 Avoid nephrotoxic agents Advised to maintain adequate hydration On lisinopril -HCTZ for HTN

## 2024-03-02 DIAGNOSIS — M9905 Segmental and somatic dysfunction of pelvic region: Secondary | ICD-10-CM | POA: Diagnosis not present

## 2024-03-02 DIAGNOSIS — M62830 Muscle spasm of back: Secondary | ICD-10-CM | POA: Diagnosis not present

## 2024-03-02 DIAGNOSIS — M9902 Segmental and somatic dysfunction of thoracic region: Secondary | ICD-10-CM | POA: Diagnosis not present

## 2024-03-02 DIAGNOSIS — M9903 Segmental and somatic dysfunction of lumbar region: Secondary | ICD-10-CM | POA: Diagnosis not present

## 2024-03-11 NOTE — Progress Notes (Unsigned)
 Cardiology Office Note   Date:  03/12/2024   ID:  Brent Brock, DOB 19-Apr-1945, MRN [id number]  PCP:  Tobie Suzzane POUR, MD  Cardiologist:   Toren Tucholski, MD   No chief complaint on file.     History of Present Illness: Brent Brock is a 78 y.o. male who presents for follow up CAD.  He has with a history of a myocardial infarction in 2000 requiring percutaneous coronary intervention of the LAD ( [name]).  He also has a history of hypertension, hyperlipidemia,  and a former history of tobacco abuse. He is a retired runner, broadcasting/film/video.   On follow up today he is doing very well. No cardiac symptoms of chest pain, dyspnea, palpitations. Has gained 6 lbs since last visit and does get gassed when pushing a wheelbarrow up hill.  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  BP diary shows excellent BP control- actually low at times.  No dizziness. He feels he is doing well.    Past Medical History:  Diagnosis Date   Anxiety    ASCVD (arteriosclerotic cardiovascular disease)     PTCA of LAD in 03/1998; recath a few weeks later-no restenosis; apical MI in 04/1998; restenosis of the LAD-stent placed; rotational atherectomy of first diagonal   BPH (benign prostatic hyperplasia)    Chronic prostatitis    Diverticulosis    Elevated PSA    Enlarged prostate    GERD (gastroesophageal reflux disease)    History of coronary angioplasty    Hyperlipidemia    Hypertension    Nicotine dependence    Past use of tobacco    40 pack years discontinued in 03/1998   Seborrheic keratoses    Spondylolysis     Past Surgical History:  Procedure Laterality Date   COLONOSCOPY     CORONARY STENT PLACEMENT     x 2   POLYPECTOMY  1997   Polyps removed on vocal cords      Current Outpatient Medications  Medication Sig Dispense Refill   amLODipine  (NORVASC ) 5 MG tablet Take 1 tablet (5 mg total) by mouth daily. 90 tablet 3   aspirin EC 81 MG tablet Take 81  mg by mouth daily.     atorvastatin  (LIPITOR) 40 MG tablet TAKE ONE TABLET (40MG  TOTAL) BY MOUTH DAILY 90 tablet 3   EPINEPHrine  (EPIPEN  2-PAK) 0.3 mg/0.3 mL IJ SOAJ injection Inject 0.3 mg into the muscle as needed for anaphylaxis. 2 each 5   esomeprazole  (NEXIUM ) 40 MG capsule Take 1 capsule (40 mg total) by mouth daily before breakfast. 30 capsule 11   finasteride (PROSCAR) 5 MG tablet Take 5 mg by mouth daily.     lisinopril  (ZESTRIL ) 20 MG tablet Take 1 tablet (20 mg total) by mouth daily. 90 tablet 3   metoprolol  succinate (TOPROL -XL) 50 MG 24 hr tablet TAKE ONE (1) TABLET BY MOUTH EVERY DAY 90 tablet 3   nitroGLYCERIN  (NITROSTAT ) 0.4 MG SL tablet Place 1 tablet (0.4 mg total) under the tongue every 5 (five) minutes as needed for chest pain. 25 tablet 3   betamethasone  dipropionate 0.05 % cream Apply topically 2 (two) times daily. (Patient not taking: Reported on 03/12/2024) 30 g 5   No current facility-administered medications for this visit.    Allergies:   Pneumovax 23 [pneumococcal vac polyvalent], Yellow jacket venom [bee venom], Penicillins, and Sulfa antibiotics    Social History:  The patient  reports that he quit smoking about 25 years ago. His smoking use included pipe and cigarettes. He started smoking about 64 years ago. He has a 38.2 pack-year smoking history. He has never used smokeless tobacco. He reports current alcohol use. He reports that he does not use drugs.   Family History:  The patient's family history includes Breast cancer in his daughter; CAD in an other family member; Heart attack in his father and mother; Heart disease in his father.    ROS:  Please see the history of present illness.   Otherwise, review of systems are positive for none.   All other systems are reviewed and negative.    PHYSICAL EXAM: VS:  BP 110/74 (BP Location: Left Arm, Patient Position: Sitting, Cuff Size: Normal)   Pulse 79   Ht 5' 7 (1.702 m)   Wt 183 lb 1.6 oz (83.1 kg)   SpO2  97%   BMI 28.68 kg/m  , BMI Body mass index is 28.68 kg/m. GEN: Well nourished, well developed, in no acute distress HEENT: normal Neck: no JVD, carotid bruits, or masses Cardiac: RRR; no murmurs, rubs, or gallops,no edema  Respiratory:  clear to auscultation bilaterally, normal work of breathing GI: soft, nontender, nondistended, + BS MS: no deformity or atrophy Skin: warm and dry, no rash Neuro:  Strength and sensation are intact Psych: euthymic mood, full affect   EKG Interpretation Date/Time:  Thursday March 12 2024 14:59:58 EST Ventricular Rate:  79 PR Interval:  154 QRS Duration:  94 QT Interval:  368 QTC Calculation: 421 R Axis:   -6  Text Interpretation: Sinus rhythm with Fusion complexes Possible Inferior infarct , age undetermined When compared with ECG of 19-Apr-2023 09:02,  No significant change was found  Confirmed by Trana Ressler [phone]) on 03/12/2024 3:07:38 PM     Recent Labs: 02/12/2024: ALT 19; BUN 19; Creatinine, Ser 1.43; Hemoglobin 15.0; Platelets 158; Potassium 3.7; Sodium 142; TSH 6.460    Lipid Panel    Component Value Date/Time   CHOL 129 02/12/2024 0814   TRIG 104 02/12/2024 0814   TRIG 163 05/19/2009 0000   HDL 39 (L) 02/12/2024 0814   CHOLHDL 3.3 02/12/2024 0814   CHOLHDL 3.3 07/27/2013 1401   VLDL 16 07/27/2013 1401   LDLCALC 71 02/12/2024 0814    Labs dated 06/14/21: cholesterol 130, triglycerides 91, LDL 75. HDL 38. BUN 27, creatinine 1.36. glucose 129- nonfasting. CBC normal.  Dated 05/17/22: cholesterol 124, triglycerides 134, LDL 59. HDL 43. Glucose 118. A1c 6%. Creatinine 1.23. otherwise CMET normal. TSH 6.12. CBC normal.   Wt Readings from Last 3 Encounters:  03/12/24 183 lb 1.6 oz (83.1 kg)  02/25/24 181 lb 6.4 oz (82.3 kg)  02/10/24 180 lb 1.3 oz (81.7 kg)      Other studies Reviewed: Additional studies/ records that were reviewed today include: none. Review of the above records demonstrates: N/A   ASSESSMENT  AND PLAN:  1.  CAD:  Remote intervention in setting of MI in 2000 with POBA of LAD.    Remains asymptomatic. Continue ASA, statin, beta blocker and amlodipine    2. Essential hypertension:BP is very well controlled.  I am concerned that his BP runs low at times and his renal function was a little worse this last month. Recommend he stop HCT. Continue lisinopril , amlodipine  and beta blocker.    3  Hyperlipidemia  LDL 71. On lipitor.      Current medicines are reviewed at length with the patient today.  The patient does not have concerns regarding medicines.  The following changes have been made:  no change  Labs/ tests ordered today include:   Orders Placed This Encounter  Procedures   EKG 12-Lead         Disposition:   FU with me in 6 months  Signed, Marcia Hartwell, MD  03/12/2024 3:18 PM    [facility] [address] [place], [place], [address] Phone [phone], Fax [phone]

## 2024-03-12 ENCOUNTER — Ambulatory Visit: Admitting: Cardiology

## 2024-03-12 ENCOUNTER — Encounter: Payer: Self-pay | Admitting: Cardiology

## 2024-03-12 VITALS — BP 110/74 | HR 79 | Ht 67.0 in | Wt 183.1 lb

## 2024-03-12 DIAGNOSIS — E785 Hyperlipidemia, unspecified: Secondary | ICD-10-CM | POA: Diagnosis not present

## 2024-03-12 DIAGNOSIS — N1831 Chronic kidney disease, stage 3a: Secondary | ICD-10-CM | POA: Diagnosis not present

## 2024-03-12 DIAGNOSIS — I2510 Atherosclerotic heart disease of native coronary artery without angina pectoris: Secondary | ICD-10-CM

## 2024-03-12 DIAGNOSIS — I10 Essential (primary) hypertension: Secondary | ICD-10-CM | POA: Diagnosis not present

## 2024-03-12 MED ORDER — LISINOPRIL 20 MG PO TABS: 20.0000 mg | ORAL_TABLET | Freq: Every day | ORAL | 3 refills | Status: AC

## 2024-03-12 NOTE — Patient Instructions (Addendum)
 Medication Instructions:  Stop Lisinopril  with hydrochlorothiazide  Start Lisinopril  20 mg daily Continue all other medications *If you need a refill on your cardiac medications before your next appointment, please call your pharmacy*  Lab Work: None ordered   Testing/Procedures: None ordered  Follow-Up: At [facility], you and your health needs are our priority.  As part of our continuing mission to provide you with exceptional heart care, our providers are all part of one team.  This team includes your primary Cardiologist (physician) and Advanced Practice Providers or APPs (Physician Assistants and Nurse Practitioners) who all work together to provide you with the care you need, when you need it.  Your next appointment:  6 months    Call in Feb to schedule June appointment     Provider:  Dr.Jordan   We recommend signing up for the patient portal called MyChart.  Sign up information is provided on this After Visit Summary.  MyChart is used to connect with patients for Virtual Visits (Telemedicine).  Patients are able to view lab/test results, encounter notes, upcoming appointments, etc.  Non-urgent messages can be sent to your provider as well.   To learn more about what you can do with MyChart, go to [email or web address].

## 2024-03-20 ENCOUNTER — Ambulatory Visit

## 2024-08-25 ENCOUNTER — Ambulatory Visit: Admitting: Internal Medicine

## 2024-09-11 ENCOUNTER — Ambulatory Visit: Admitting: Cardiology
